# Patient Record
Sex: Female | Born: 1970
Health system: Southern US, Community
[De-identification: ages and names within clinical notes are randomized; demographics above are authoritative.]

## PROBLEM LIST (undated history)

## (undated) DIAGNOSIS — N189 Chronic kidney disease, unspecified: Secondary | ICD-10-CM

## (undated) DIAGNOSIS — J449 Chronic obstructive pulmonary disease, unspecified: Secondary | ICD-10-CM

## (undated) HISTORY — PX: FOOT FRACTURE SURGERY: SHX645

## (undated) HISTORY — PX: OTHER SURGICAL HISTORY: SHX169

---

## 1998-05-28 ENCOUNTER — Emergency Department (HOSPITAL_COMMUNITY): Admission: EM | Admit: 1998-05-28 | Discharge: 1998-05-28 | Payer: Self-pay | Admitting: Emergency Medicine

## 1998-05-28 ENCOUNTER — Encounter: Payer: Self-pay | Admitting: Emergency Medicine

## 2001-10-12 ENCOUNTER — Emergency Department (HOSPITAL_COMMUNITY): Admission: EM | Admit: 2001-10-12 | Discharge: 2001-10-12 | Payer: Self-pay

## 2001-10-12 ENCOUNTER — Encounter: Payer: Self-pay | Admitting: Emergency Medicine

## 2002-06-15 ENCOUNTER — Encounter: Payer: Self-pay | Admitting: Emergency Medicine

## 2002-06-15 ENCOUNTER — Inpatient Hospital Stay (HOSPITAL_COMMUNITY): Admission: EM | Admit: 2002-06-15 | Discharge: 2002-06-17 | Payer: Self-pay | Admitting: Emergency Medicine

## 2002-11-29 ENCOUNTER — Encounter: Payer: Self-pay | Admitting: Emergency Medicine

## 2002-11-29 ENCOUNTER — Emergency Department (HOSPITAL_COMMUNITY): Admission: EM | Admit: 2002-11-29 | Discharge: 2002-11-29 | Payer: Self-pay | Admitting: Emergency Medicine

## 2003-03-10 ENCOUNTER — Emergency Department (HOSPITAL_COMMUNITY): Admission: EM | Admit: 2003-03-10 | Discharge: 2003-03-11 | Payer: Self-pay | Admitting: Emergency Medicine

## 2005-09-21 ENCOUNTER — Inpatient Hospital Stay (HOSPITAL_COMMUNITY): Admission: EM | Admit: 2005-09-21 | Discharge: 2005-09-24 | Payer: Self-pay | Admitting: Emergency Medicine

## 2005-09-30 ENCOUNTER — Inpatient Hospital Stay (HOSPITAL_COMMUNITY): Admission: EM | Admit: 2005-09-30 | Discharge: 2005-10-04 | Payer: Self-pay | Admitting: Emergency Medicine

## 2006-02-22 ENCOUNTER — Inpatient Hospital Stay (HOSPITAL_COMMUNITY): Admission: EM | Admit: 2006-02-22 | Discharge: 2006-02-23 | Payer: Self-pay | Admitting: Emergency Medicine

## 2006-03-22 ENCOUNTER — Emergency Department (HOSPITAL_COMMUNITY): Admission: EM | Admit: 2006-03-22 | Discharge: 2006-03-22 | Payer: Self-pay | Admitting: Emergency Medicine

## 2006-03-24 ENCOUNTER — Emergency Department (HOSPITAL_COMMUNITY): Admission: EM | Admit: 2006-03-24 | Discharge: 2006-03-25 | Payer: Self-pay | Admitting: Emergency Medicine

## 2006-03-25 ENCOUNTER — Emergency Department (HOSPITAL_COMMUNITY): Admission: EM | Admit: 2006-03-25 | Discharge: 2006-03-25 | Payer: Self-pay | Admitting: Emergency Medicine

## 2006-03-27 ENCOUNTER — Emergency Department (HOSPITAL_COMMUNITY): Admission: EM | Admit: 2006-03-27 | Discharge: 2006-03-28 | Payer: Self-pay | Admitting: Emergency Medicine

## 2006-03-30 ENCOUNTER — Emergency Department (HOSPITAL_COMMUNITY): Admission: EM | Admit: 2006-03-30 | Discharge: 2006-03-30 | Payer: Self-pay | Admitting: Emergency Medicine

## 2006-08-02 ENCOUNTER — Emergency Department (HOSPITAL_COMMUNITY): Admission: EM | Admit: 2006-08-02 | Discharge: 2006-08-03 | Payer: Self-pay | Admitting: *Deleted

## 2006-12-16 ENCOUNTER — Emergency Department (HOSPITAL_COMMUNITY): Admission: EM | Admit: 2006-12-16 | Discharge: 2006-12-16 | Payer: Self-pay | Admitting: Emergency Medicine

## 2006-12-21 ENCOUNTER — Inpatient Hospital Stay (HOSPITAL_COMMUNITY): Admission: EM | Admit: 2006-12-21 | Discharge: 2006-12-24 | Payer: Self-pay | Admitting: Emergency Medicine

## 2010-04-19 ENCOUNTER — Emergency Department (HOSPITAL_BASED_OUTPATIENT_CLINIC_OR_DEPARTMENT_OTHER): Admission: EM | Admit: 2010-04-19 | Discharge: 2010-04-20 | Payer: Self-pay | Admitting: Emergency Medicine

## 2010-05-10 ENCOUNTER — Emergency Department (HOSPITAL_COMMUNITY)
Admission: EM | Admit: 2010-05-10 | Discharge: 2010-05-10 | Payer: Self-pay | Source: Home / Self Care | Admitting: Emergency Medicine

## 2010-08-18 LAB — DIFFERENTIAL
Basophils Absolute: 0 10*3/uL (ref 0.0–0.1)
Basophils Relative: 0 % (ref 0–1)
Eosinophils Absolute: 0.1 10*3/uL (ref 0.0–0.7)
Lymphs Abs: 1.5 10*3/uL (ref 0.7–4.0)
Monocytes Absolute: 0.6 10*3/uL (ref 0.1–1.0)
Neutro Abs: 8.1 10*3/uL — ABNORMAL HIGH (ref 1.7–7.7)
Neutrophils Relative %: 79 % — ABNORMAL HIGH (ref 43–77)

## 2010-08-18 LAB — BASIC METABOLIC PANEL
BUN: 8 mg/dL (ref 6–23)
CO2: 25 mEq/L (ref 19–32)
GFR calc Af Amer: >60 mL/min (ref >60)
GFR calc non Af Amer: >60 mL/min (ref >60)
Glucose, Bld: 100 mg/dL — ABNORMAL HIGH (ref 70–99)

## 2010-08-18 LAB — POCT PREGNANCY, URINE: Preg Test, Ur: NEGATIVE

## 2010-08-18 LAB — URINE MICROSCOPIC-ADD ON

## 2010-08-18 LAB — URINALYSIS, ROUTINE W REFLEX MICROSCOPIC: Bilirubin Urine: NEGATIVE

## 2010-08-18 LAB — CBC
HCT: 38.9 % (ref 36.0–46.0)
Hemoglobin: 13.6 g/dL (ref 12.0–15.0)
MCH: 31.1 pg (ref 26.0–34.0)
WBC: 10.3 10*3/uL (ref 4.0–10.5)

## 2010-10-21 NOTE — Discharge Summary (Signed)
[name], [name]                ACCOUNT NO.:  [id number]   MEDICAL RECORD NO.:  [id number]          PATIENT TYPE:  INP   LOCATION:  1307                         FACILITY:  [facility]   PHYSICIAN:  Beckey Rutter, MD  DATE OF BIRTH:  September 10, 1970   DATE OF ADMISSION:  12/21/2006  DATE OF DISCHARGE:  12/24/2006                               DISCHARGE SUMMARY   PRIMARY CARE PHYSICIAN:  Unassigned to [facility].   CHIEF COMPLAINT:  Left flank pain, dizziness and swelling.  She was  status post I & D on local abscess on December 20, 2006, in the emergency  department.[name]   HISTORY OF PRESENT ILLNESS:  A 40 year old female with past medical  history of MRSA, came with abscess and cellulitis.   HOSPITAL COURSE:  During hospitalization, the patient was seen and  evaluated by surgery and she was continued on antibiotic.  The culture  came back to E-coli sensitive to Cipro, Rocephin, cefazolin, gentamicin  and tobramycin and resistant to Bactrim and ampicillin.  Now the patient  is stable for discharge.  She will be discharged on Cipro and she was  recommended to follow up with her primary physician for surgery.  During  hospitalization, the patient was requiring significant amount of pain  medication, and now she was insistent on getting narcotic for pain  control.  I will discharge the patient on short supply of OxyContin  twice a day and Dilaudid p.o., hence the patient wanted Dilaudid because  it was really improving and elevating pain during hospitalization.  The  patient is stable for discharge today.  She is aware to the plan.   DISCHARGE DIAGNOSIS/FINAL DIAGNOSIS:  1. Left flank abscess/cellulitis sensitive to Cipro, Rocephin,      cefazolin, gentamicin and tobramycin and resistant to Bactrim and      ampicillin.  2. Hepatitis C diagnosed about 2 years ago.  3. History of recurrent pyelonephritis.  4. Chronic pain syndrome.  5. History of alcohol and tobacco abuse.  6. Remote history of  cocaine abuse.  7. History of kidney stones.  8. Status post IUD placement in the past.   DISCHARGE MEDICATIONS:  1. OxyContin.  2. Dilaudid p.o.  3. Cipro 250 mg p.o. b.i.d. for five more days.     Beckey Rutter, MD  Electronically Signed    EME/MEDQ  D:  12/24/2006  T:  12/25/2006  Job:  [id number]

## 2010-10-21 NOTE — H&P (Signed)
NAMEREGINA, GANCI NO.:  [id number]   MEDICAL RECORD NO.:  [id number]          PATIENT TYPE:  EMS   LOCATION:  ED                           FACILITY:  [facility]   PHYSICIAN:  Elliot Cousin, M.D.    DATE OF BIRTH:  May 19, 1971   DATE OF ADMISSION:  12/21/2006  DATE OF DISCHARGE:                              HISTORY & PHYSICAL   PRIMARY CARE PHYSICIAN:  The patient is unassigned.   CHIEF COMPLAINT:  Left flank pain, redness, swelling, status post I&D of  local abscess on December 20, 2006, in the Emergency Department.   HISTORY OF PRESENT ILLNESS:  The patient is a 40 year old woman with a  past medical history significant for hepatitis C, recurrent  pyelonephritis, and chronic pain syndrome.  She presented to the  Emergency Department last night after experiencing a 3-day history of  left flank pain.  She believes she was bitten by a bug approximately 3  days ago.  The area became slightly inflamed and red.  Over the next 24-  48 hours afterwards, the area became more swollen, more painful, and  more red.  She had associated nausea and chills.  At that time, it  appeared that the area was about to drain.  She presented to the  Emergency Department last night where apparently the local abscess was  excised and drained (no actual report available).  She was sent home  yesterday on doxycycline of which she took several doses.  However, the  area over the incision site has become much more red, swollen, and  painful.  In fact, the redness has spread several more inches according  to the patient and the patient's fiance.  The patient denies painful  urination, melena, hematochezia, abdominal pain, or bright red blood in  her urine.  She has had a couple of episodes of nausea and vomiting  which she believes are secondary to the infection and not necessarily  the antibiotic.   During the evaluation in the Emergency Department this morning, the  patient is noted to be  afebrile and hemodynamically stable.  Her white  blood cell count is elevated at 12.3.  An ultrasound of the abdomen and  kidneys was ordered by the Emergency Department physician to rule out a  deeper abscess.  The ultrasound was negative for abscess.  Given the  progression of the patient's symptoms and clinical findings, she will be  admitted for further evaluation and management.   PAST MEDICAL HISTORY:  1. Hepatitis C diagnosed approximately 1-1/2 years ago.  2. History of recurrent pyelonephritis.  3. Chronic pain syndrome.  4. History of alcohol and tobacco abuse/use.  5. Remote history of cocaine abuse, now discontinued.  6. History of kidney stones.  7. Status post IUD placement in the past.   MEDICATIONS:  1. Doxycycline 100 mg b.i.d.  2. Percocet 5 mg q.4 h. as needed for pain (prescribed by the      Emergency Department physician).   ALLERGIES:  No known drug allergies.   SOCIAL HISTORY:  The patient is engaged to be married.  She has 1 child.  [place]  She is currently unemployed.  She smokes 1 pack of cigarettes per day.  She drinks 3-4 beers once or  twice weekly.  She denies illicit drug use of any kind.   FAMILY HISTORY:  Her mother is 42 years of age and underwent recent  brain surgery for a benign tumor.  She also has hypertension.  Her  father is 66 years of age and has leukemia.   REVIEW OF SYSTEMS:  Positive for chronic pain, chronic urinary tract  infections.   PHYSICAL EXAMINATION:  VITAL SIGNS:  Temperature 97.8, blood pressure  111/82, pulse 76, respiratory rate 24, oxygen saturation 98% on room  air.  GENERAL:  The patient is a 40 year old, Caucasian woman who is currently  lying in bed in no acute distress.  HEENT:  Head is normocephalic and atraumatic.  Pupils equal, round, and  reactive to light.  Extraocular movements are intact.  Conjunctivae are  clear.  Sclerae are white.  Nasal mucosa is mildly dry.  No  sinus  tenderness.  Oropharynx reveals a full set of dentures.  Mucous  membranes are mildly dry.  No posterior exudates or erythema.  NECK:  Supple.  No adenopathy.  No thyromegaly.  No bruit.  No JVD.  LUNGS:  Clear to auscultation bilaterally.  HEART:  S1, S2 with no murmurs, rubs, or gallops.  ABDOMEN:  Left flank below the umbilicus reveals a large area of  erythema. The focal incision site is without any active purulent  drainage at this time.  There is surrounding induration and moderate  tenderness.  The remainder of the abdominal exam is within normal limits  with positive bowel sounds, soft, nondistended, and no  hepatosplenomegaly.  GU:  Deferred.  RECTAL:  Deferred.  EXTREMITIES:  Pedal pulses are 2+ bilaterally.  No pretibial edema and  no pedal edema.  NEUROLOGIC:  The patient is alert and oriented x3.  Cranial nerves II-  XII are intact.   ADMISSION LABORATORIES:  Ultrasound of the abdomen negative for abscess.  WBC 12.3, hemoglobin 13.9, platelets 202,000, sodium 133, potassium 3.2,  chloride 103, CO2 24, glucose 96, BUN 8, creatinine 0.68, calcium 8.6,  total protein 7.3, albumin 3.8, AST 19, ALT 33.   ASSESSMENT:  1. Left flank abscess/cellulitis.  The patient failed outpatient      therapy.  She is currently afebrile; however, she does have      leukocytosis.  2. Nausea and vomiting, possibly secondary to the infection.  Rule out      gastritis and acute pancreatitis.  3. Hypokalemia.  The patient's serum potassium is 3.2.  We will need      to rule out magnesium deficiency.  4. Tobacco and alcohol use.   PLAN:  1. The patient will be admitted for further evaluation and management.  2. For further evaluation, we will check wound and blood cultures.  3. We will check Lyme and [facility] spotted fever titers.  4. We will check a urinalysis.  We will also check a magnesium level      to rule out deficiency.  5. The patient was given a diphtheria tetanus  toxoid by the Emergency      Department physician.  6. We will start empiric antibiotic treatment with intravenous      vancomycin and Fortaz.  7. General surgery consultation.  8. Pain management.  9. Replete potassium chloride.  10.Tobacco and alcohol cessation counseling.  We will place a nicotine      patch.  We will start vitamin therapy and p.r.n. Ativan.      Elliot Cousin, M.D.  Electronically Signed     DF/MEDQ  D:  12/21/2006  T:  12/21/2006  Job:  [id number]

## 2010-10-24 NOTE — Discharge Summary (Signed)
[name], [name]            ACCOUNT NO.:  [id number]   MEDICAL RECORD NO.:  [id number]          PATIENT TYPE:  INP   LOCATION:  3012                         FACILITY:  [facility]   PHYSICIAN:  Lonia Blood, M.D.      DATE OF BIRTH:  [birth year]   DATE OF ADMISSION:  09/21/2005  DATE OF DISCHARGE:  09/24/2005                                 DISCHARGE SUMMARY   PRIMARY CARE PHYSICIAN:  Patient is unassigned, but she will get a primary  care physician at [facility].   DISCHARGE DIAGNOSES:  1.  Acute pyelonephritis.  2.  Tobacco abuse.  3.  Nausea/vomiting, probably secondary to pyelonephritis pain.   DISCHARGE MEDICATIONS:  1.  Ciprofloxacin 500 mg p.o. b.i.d. for 5 more days.  2.  Vicodin 5/500 1-2 tablets every 6 hours p.r.n.  3.  Phenergan 25 mg q.6h. p.r.n.   DISPOSITION:  Patient will be discharged today.  She lives in [place], and  she will follow up with her primary care physician over there.  She  currently is between doctors, and she is going to get a new doctor when she  goes home to [place].  Pain ieboros reasonably controlled right now.  She is  afebrile and white count has returned to normal.   PROCEDURES PERFORMED THIS ADMISSION:  CT abdomen and pelvis performed on  September 21, 2005, shows no acute pelvic finding and normal appearance of the  upper abdomen as well.   CONSULTATIONS:  None.   BRIEF HISTORY AND PHYSICAL:  Please refer to dictated history and physical  on admission by Dr. Lonia Blood.  It showed, however, patient is a 40-  year-old Caucasian female.  She has history of multiple episodes of  nephrolithiasis with some lithotripsy in the past.  She had temporary  stenting at the time, of the right ureter, back in August 2003.  Since then,  patient has had multiple urinary tract infections.  She came in secondary to  severe right flank pain.  Prior to that, she was in [facility] where  she was diagnosed with acute gastroenteritis.  On arrival in  the ED, patient  was having intractable nausea/vomiting, which has lasted more than 24 hours.  She has also had some fevers and chills intermittently.  Her labs show  urinalysis with small leukocyte esterase, 3-6 white blood cells.  She has  some trichomonas.  Her white count is elevated at 15,000.  She was having  some costovertebral angle tenderness, as well.  Patient was subsequently  admitted with an interim diagnosis of pyelonephritis.   HOSPITAL COURSE:  1.  Acute pyelonephritis:  Patient was admitted, started on IV ciprofloxacin      in the hospital.  She was getting that q.12h. so far in the hospital.      We will, therefore, continue with the ciprofloxacin orally, and she will      complete 7-10 days of antibiotics.  Her pain is still there but has      lessened.  We did a workup for possible kidney stones with her prior      history  of kidney stones to see if this is related; however, that has      been negative.  Urine culture was also checked and showed insignificant      growth at this point.  She will complete a coarse of 7 days of      antibiotics.  2.  Nausea/vomiting:  This is most likely secondary to her pyelo.  We      maintained the patient on some Phenergan in the hospital, including some      Zofran occasionally, and that has since resolved her nausea and vomiting      and she is eating adequately now.  3.  Tobacco abuse:  Patient has been counseled, and she also used a nicotine      patch while in the hospital.  She is thinking of quitting according to      her.  Otherwise, the patient has been stable for the most part, except      for severe pain that she has been looking for medications on and off      while in the hospital.      Lonia Blood, M.D.  Electronically Signed     LG/MEDQ  D:  09/24/2005  T:  09/25/2005  Job:  [id number]

## 2010-10-24 NOTE — Discharge Summary (Signed)
[name], [name] NO.:  [id number]   MEDICAL RECORD NO.:  [id number]                   PATIENT TYPE:  INP   LOCATION:  5532                                 FACILITY:  [facility]   PHYSICIAN:  Oretha Ellis, M.D.                DATE OF BIRTH:  12/05/1970   DATE OF ADMISSION:  06/15/2002  DATE OF DISCHARGE:  06/17/2002                                 DISCHARGE SUMMARY   DISCHARGE DIAGNOSES:  1. Pyelonephritis, Escherichia coli.  2. Bacterial vaginosis.  3. Menorrhagia.   DISCHARGE MEDICATIONS:  1. Bactrim Double Strength one pill b.i.d. for 14 days.  2. Flagyl 500 mg p.o. b.i.d. for five days.  3. Percocet 5/325 mg one to two tablets every four hours if needed for pain.   HOSPITAL COURSE:  [name] is a 40 year old female with past medical  history significant for stenting of her right ureter in August, 2003 along  with lithotripsy in August, 2003 and placement of an IUD about four years  ago.  The patient presented to the emergency department with severe back  pain, nausea, vomiting and fever of 101 times one day.  She took Tylenol for  the pain without relief.  She also complained of dysuria, increased urinary  symptoms and foul smelling urine.  She also complained of vaginal discharge  with foul odor approximately one month ago to which she presented to her  gynecologist and was treated with Flagyl.  In addition, the patient  complains of heavy menses lasting three weeks duration over the last three  months.  In the emergency department she was given 4 mg of Dilaudid and 10  mg of Toradol without any relief of her pain.  She was admitted for pain  control.   PHYSICAL EXAMINATION:  Her admitting vital signs - pulse 76, blood pressure  116/74, temperature 98.6, respirations 22, saturations 100% on room air.  She was an ill-appearing female who was writhing in pain and crying during  the examination.  Physical examination significant for  positive CVA  tenderness, left greater than right.  Pelvic examination showing no external  lesions or abnormalities.  No discharge.  Positive cervical motion  tenderness and adnexal suite tenderness.  CT scan of the abdomen showed no evidence of bone disease nor renal stones  nor hydronephrosis nor evidence of inflammatory disease.  Urinalysis was  positive nitrites, small leukocyte esterase and the microscopic showed many  bacteria, 3-6 white blood cells, 3-6 red blood cells.   HOSPITAL COURSE:  1. Pyelonephritis.  On day two of her hospitalization the patient was     started on Tequin and Flagyl.  The Tequin was to cover presumed     pyelonephritis and the Flagyl was to cover bacteria vaginosis because a     clue cell was found on her pelvic exam.  On day two of the     hospitalization urine culture  came back positive for greater than 100,000     colonies of E. Coli.  The patient defervesced and E. Coli was sensitive     to Septra so she was sent out on oral Septra Double Strength one tablet     twice a day for 14 days.  2. Bacteria vaginosis.  Patient was to continue a seven day course of     Flagyl.  3. Menorrhagia.  This is to be evaluated in the outpatient setting by the     patient's gynecologist.   DISPOSITION:  The patient was sent home in stable condition with Percocet  5/325 for pain control.  If she should have another occurrence of  pyelonephritis she will need to see a urologist to evaluate these frequent  urinary tract infections.  Of note, the patient had another hospitalization  for a similar episode in May, 2003.   DISCHARGE LABORATORY:  Sodium 138, potassium 4.3, chloride 108, bicarbonate  24, BUN 5, creatinine 0.7, glucose 92, potassium  8.2.                                               Oretha Ellis, M.D.    BT/MEDQ  D:  06/21/2002  T:  06/21/2002  Job:  [id number]

## 2010-10-24 NOTE — H&P (Signed)
NAMEVARETTA, CHAVERS NO.:  [id number]   MEDICAL RECORD NO.:  [id number]          PATIENT TYPE:  INP   LOCATION:  1823                         FACILITY:  [facility]   PHYSICIAN:  Lonia Blood, M.D.DATE OF BIRTH:  [birth year]   DATE OF ADMISSION:  09/21/2005  DATE OF DISCHARGE:                                HISTORY & PHYSICAL   PRIMARY CARE PHYSICIAN:  Unassigned.   CHIEF COMPLAINT:  Severe right flank pain with nausea and vomiting.   HISTORY OF PRESENT ILLNESS:  [name] is a 40 year old female  with a significant history of multiple episodes of nephrolithiasis.  She has  required lithotripsy in the past.  She has also had other complications  which she cannot describe further requiring a temporary stenting of the  right ureter in August of 2003.  Since that time, she has had frequent  recurrent urinary tract infections.  Approximately one week ago, she began  to feel tired all over.  She had no other localizing symptoms.  As the  week progressed, she began to experience vague nausea.  Her appetite  decreased.  She then began to feel intermittent fevers and chills.  Over the  last 48 hours, she has developed progressively worsening right-sided flank  pain.  This pain is described as 10/10 and is unrelenting.  Over the last 24  hours, this has become associated with severe nausea and vomiting.  The  patient has not been able to keep anything down by mouth.   She was seen in the emergency room in an outlying hospital last night and  diagnosed with a viral gastroenteritis and sent home without any further  therapy.   REVIEW OF SYSTEMS:  Comprehensive review of systems is unremarkable with the  exception of positive elements noted in the history of present illness  above.   PAST MEDICAL HISTORY:  1.  Admit with Escherichia coli pyelonephritis January 2004.  2.  History of bacterial vaginosis.  3.  Menorrhagia.  4.  Stenting of the right ureter in  August of 2003/reasons unclear.  5.  Lithotripsy August 2003.  6.  Tobacco abuse in the amount of one pack per day x15 years.   OUTPATIENT MEDICATIONS:  None.   ALLERGIES:  No known drug allergies.   FAMILY HISTORY:  The patient's mother is alive and healthy.  The patient's  father is alive but has leukemia and diabetes.  The patient reports a  strong cancer history on her father's side, though she cannot further  explain.  She has multiple brothers and sisters all of whom are healthy.   SOCIAL HISTORY:  The patient lives in [place].  She occasionally partakes  of alcohol but not more than one to two on average every other day.  The  patient is presently unemployed.  She has one child who is healthy.   LABORATORY DATA:  Urine pregnancy is negative.  Urinalysis reveals 250  glucose, small leukocyte esterase, 3 to 6 white blood cells and Trichomonas.  White count is elevated at 15,000, hemoglobin is normal, platelet count is  normal, MCV is normal.  Sodium, potassium, bicarbonate, chloride are normal.  BUN is 4, creatinine is 0.6, calcium is 9, serum glucose is elevated at 124.  LFTs are normal.  Albumin is normal.  Lipase is 23.  CT scan of the abdomen  and pelvis reveals no evidence of nephrolithiasis.  Appendix is normal.  There is an IUD noted to be present.  Exam is otherwise unremarkable.   PHYSICAL EXAMINATION:  VITAL SIGNS:  Temperature 96.8, blood pressure  129/84, heart rate 89, respiratory rate 20, O2 saturation is 95% on room  air.  GENERAL:  Well-developed, well-nourished female in no acute respiratory  distress.  HEENT:  Normocephalic and atraumatic.  OC/OP clear.  NECK:  No JVD, no lymphadenopathy.  LUNGS:  Clear to auscultation bilaterally without wheezes or rhonchi.  CARDIOVASCULAR:  Regular rate and rhythm without gallop, rub or appreciable  murmur.  ABDOMEN:  Soft.  Bowel sounds are hypoactive but present.  The patient is  tender to deep palpation throughout.   The patient shows exquisite tenderness  to palpation at the right costovertebral angle.  The patient is nondistended  and abdomen is soft.  EXTREMITIES:  No significant clubbing, cyanosis, or edema bilateral lower  extremities.  NEUROLOGICAL:  Nonfocal neurologic exam.   IMPRESSION/PLAN:  1.  Pyelonephritis:  The patient has a severe pyelonephritis.  This is      likely a complicated pyelonephritis in this patient who has a history of      nephrolithiasis and urinary stenting.  We of course will obtain a urine      culture to assure that this is not a drug-resistant organism to which      this patient would be highly susceptible.  She will be empirically      treated with Cipro IV q.12h.  This will be titrated to p.o. as soon as      the patient is able to tolerate p.o. intake.  There does not appear to      be evidence of an obstructing stone or other complicating factors at the      present time and therefore urology involvement is not needed presently.  2.  Intractable nausea and vomiting:  This is a result of the patient's      significant pyelonephritis.  The patient will be kept on clear liquids      only initially.  She will be given proton pump inhibitor and      antiemetics.  We will advance diet as able.  3.  Severe right costovertebral angle pain:  Again, this is a sequelae of      the patient's severe pyelonephritis.  Dilaudid will be administered IV      in attempts to control the patient's pain.  Phenergan will be given for      a synergistic effect.  4.  Trichomonas vaginitis:  The patient is primarily asymptomatic at the      present time.  She does, however, need to be treated as she is presently      sexually active.  I have not initiated treatment at present time as the      patient is not likely to be able to tolerate 2 gm of Flagyl p.o. at the      present time.  After the patient has improved, we should treat her with     Cipro.  She should also be informed that  her sexual partner likely needs      to be treated as well.  Lonia Blood, M.D.  Electronically Signed     JTM/MEDQ  D:  09/21/2005  T:  09/21/2005  Job:  [id number]

## 2010-10-24 NOTE — H&P (Signed)
[name], [name]            ACCOUNT NO.:  [id number]   MEDICAL RECORD NO.:  [id number]          PATIENT TYPE:  INP   LOCATION:  5736                         FACILITY:  [facility]   PHYSICIAN:  Jackie Plum, M.D.DATE OF BIRTH:  04-25-1971   DATE OF ADMISSION:  02/22/2006  DATE OF DISCHARGE:                                HISTORY & PHYSICAL   CHIEF COMPLAINT:  Flank pains.   HISTORY OF PRESENT ILLNESS:  The patient is a 40 year old Caucasian lady who  presented to the ED with complaint of 1-day history of severe bladder/flank  pain with nausea, vomiting and fever.  She has had no constipation or  diarrhea.  She denies any abdominal pain.  She denies any dizziness, chest  pain, shortness of breath, cough, sputum production.  She denies any  dysuria.  She has had some frequency of micturition.  The patient has been  treated for similar symptoms previously at [facility].   PAST MEDICAL HISTORY:  Positive for history of recurrent pyelonephritis and  ureteral calculi.  Has history of cigarette smoking, cocaine abuse, and  heavy alcohol use.  She has not taken alcohol for more than 2 weeks  according to the patient.  She has history of hepatitis C.  She has history  of chronic pain syndrome.   MEDICATIONS:  The patient has been taking Vicodin, Phenergan and Keflex  given in [facility].   ALLERGIES:  SHE HAS NO MEDICATION ALLERGIES.   FAMILY HISTORY:  Positive for diabetes mellitus and leukemia.   REVIEW OF SYSTEMS:  As stated above otherwise unremarkable.   PHYSICAL EXAMINATION:  GENERAL:  The patient was in severe pain.  VITAL SIGNS:  Stable.  Temperature was 97.6 degrees Fahrenheit.  NECK:  Supple with no JVD.  Oropharynx was moist.  No scleral pallor.  No  icterus.  CARDIOPULMONARY:  Auscultation was unremarkable.  ABDOMEN:  Soft and nontender.  The patient has bilateral costovertebral  angle tenderness which was quite severe.  EXTREMITIES:  No  cyanosis, no edema.  CNS:  Exam was nonfocal.   LABORATORY WORK:  Sodium 140, potassium 4.1, chloride 110, BUN 19, glucose  117.  Venous pH 7.423, venous pCO2 of 33.3, venous bicarbonate 21.8,  hematocrit of 43, hemoglobin 14.6.  UA:  Color was yellow, appearance  cloudy, specific gravity 1.028, pH 6.0, glucose negative, large hemoglobin,  negative bilirubin, negative ketones, negative protein, urobilinogen 1.0,  nitrite negative, moderate leukocyte esterase, wbc's 7-10 per high power  field, rbc's too numerous too count, few bacteria noted.  WBC count 7.5,  hemoglobin 14.7, hematocrit __________, MCV 2.3, platelet count 221.   X-RAYS:  CT scan of abdomen was negative for any acute findings, there was  no renal alkali or significant change in either kidney.  Pregnancy test was  negative.   IMPRESSION:  1. Severe bilateral flank pain in a patient with history of chronic pain.  2. Nausea and vomiting with pyuria.  3. Acute pyelonephritis.   PLAN:  The patient is admitted for IV fluids, IV analgesics with pain  control, antiemetics, antinausea, supportive care.  We will get  renal  ultrasound to further evaluate.  We will monitor her electrolytes.  We will  check her upper GI symptoms and follow clinically.  Further recommendations  will made after the database expands.      Jackie Plum, M.D.  Electronically Signed     GO/MEDQ  D:  02/22/2006  T:  02/22/2006  Job:  [id number]

## 2010-10-24 NOTE — Discharge Summary (Signed)
NAMEDABNEY, DEVER            ACCOUNT NO.:  [id number]   MEDICAL RECORD NO.:  [id number]          PATIENT TYPE:  INP   LOCATION:  5705                         FACILITY:  [facility]   PHYSICIAN:  Lonia Blood, M.D.      DATE OF BIRTH:  08/08/1970   DATE OF ADMISSION:  09/29/2005  DATE OF DISCHARGE:  10/04/2005                                 DISCHARGE SUMMARY   PRIMARY CARE PHYSICIAN:  Patient is unassigned to [place].  Her primary care  physician is in [place], [place].   DISCHARGE DIAGNOSES:  1.  Recurrent pyelonephritis with negative urine culture this time around.  2.  Chronic pain syndrome.  3.  Persistent nausea.  4.  Tobacco abuse.  5.  Hepatitis C positive.   DISCHARGE MEDICATIONS:  1.  Ciprofloxacin 500 mg p.o. b.i.d. x10 days.  2.  Zofran 4 mg q.4h. p.r.n.  3.  OxyContin 10 mg q.4h. p.r.n.   DISPOSITION:  Patient is to follow up this week with her primary care  physician in [place], [place].  She is also going to follow with her  previous urologist.  She apparently had a stent in the past placed in her  ureters but that has been removed.  With her recurrent symptoms, she will  need to be followed over there.  Work-up so far has been negative here.   PROCEDURES PERFORMED:  1.  CT abdomen and pelvis performed on September 30, 2005, shows no acute      abdominal findings an no acute CT pelvic findings.  2.  A pelvic ultrasound also performed on October 03, 2005, showed left      ovarian cyst measuring 2.9 cm, otherwise no acute findings.   CONSULTATIONS:  None.   BRIEF HISTORY AND PHYSICAL:  Please refer to dictated history and physical  by Dr. Hettie Holstein on admission.  In short, however, patient was  discharged from our service on September 24, 2005, secondary to same diagnosis  of acute pyelonephritis.  At that time, work-up was essentially negative.  The patient was on IV ciprofloxacin which was later switched to p.o. to  complete for seven to 10 days.  She  had a work-up for kidney stones which  was also negative.  Her urine culture was also negative at that time.  She  was subsequently discharged on antibiotics and pain medicine, but patient  returned this time around after five days with the same complaint, moaning  and writhing in pain.  Initial work-up in the ED showed mainly UA with wbc  too numerous to count.  Urine drug screen was positive for  tetrahydrocannabinol.  Patient was admitted for recurrent pyelonephritis.   HOSPITAL COURSE:  PROBLEM #1 -  RECURRENT PYELONEPHRITIS:  Patient was again  placed on IV ciprofloxacin.  She had no fever or white count.  Her urine  culture again came back as negative.  She continued to have pain or  perceived pain in the right flank area coming down to the front.  A repeat  CT abdomen, as shown above, shows no acute findings.  Subsequent pelvic  ultrasound to further see if there is any reason for the pain also was  negative.  She continued to use both Dilaudid, OxyContin and other narcotics  so frequently, sometimes every one to two hours.  I have discussed with the  patient that the cause of her pain has not been seen on any of our scans.  I  am, therefore, discharging her home to follow up with her primary care  physician and previous urologist.  Maybe they might be able to figure out  where the pain is coming from.  Her pain is relieved, however, with the pain  medicines and I am putting her back on the same medications.   PROBLEM #2 -  HEPATITIS C:  Patient's work-up in the hospital including  checking for hepatitis and other infectious causes, especially with the  right flank pain.  Her antibody came back as hepatitis C positive but others  were negative.  Patient denied any IV drug use.  With this in mind also, she  will require work-up follow-up including checking hepatitis C, RNA titers  which take a long time to return.  I have therefore informed her that she  will need to have her  primary care physician follow up on these work-ups.  I  doubt if the hepatitis C has anything to do with the pain she is having  right now.   PROBLEM #3 -  TOBACCO ABUSE:  We have counseled the patient extensively and  offered her nicotine patch which she continues to smoke at least a pack per  day.   PROBLEM #4 -  PERSISTENT NAUSEA:  Patient has had persistent nausea which  was thought to be secondary to her pyelonephritis.  She has also used  several doses of both Zofran and Phenergan more frequently.  I am  discharging her on some doses to take at home until she is seen by her  primary care physician.      Lonia Blood, M.D.  Electronically Signed     LG/MEDQ  D:  10/04/2005  T:  10/05/2005  Job:  [id number]

## 2011-03-23 LAB — DIFFERENTIAL
Basophils Absolute: 0
Eosinophils Absolute: 0.1
Eosinophils Absolute: 0.2
Lymphocytes Relative: 10 — ABNORMAL LOW
Lymphocytes Relative: 9 — ABNORMAL LOW
Lymphs Abs: 0.5 — ABNORMAL LOW
Monocytes Absolute: 0.5
Monocytes Relative: 7
Neutro Abs: 10.5 — ABNORMAL HIGH
Neutro Abs: 4
Neutrophils Relative %: 80 — ABNORMAL HIGH

## 2011-03-23 LAB — CULTURE, BLOOD (ROUTINE X 2)
Culture: NO GROWTH
Culture: NO GROWTH

## 2011-03-23 LAB — URINALYSIS, ROUTINE W REFLEX MICROSCOPIC
Bilirubin Urine: NEGATIVE
Ketones, ur: NEGATIVE
Nitrite: NEGATIVE
Protein, ur: NEGATIVE
Urobilinogen, UA: 0.2
pH: 6

## 2011-03-23 LAB — CBC
HCT: 39.3
Hemoglobin: 13.9
Platelets: 156
Platelets: 202
RBC: 3.85 — ABNORMAL LOW
RBC: 4.48
RDW: 13.6
WBC: 12.3 — ABNORMAL HIGH
WBC: 4.9

## 2011-03-23 LAB — URINE CULTURE

## 2011-03-23 LAB — COMPREHENSIVE METABOLIC PANEL
ALT: 33
Alkaline Phosphatase: 68
GFR calc non Af Amer: >60
Glucose, Bld: 96
Potassium: 3.2 — ABNORMAL LOW
Sodium: 133 — ABNORMAL LOW
Total Bilirubin: 0.9

## 2011-03-23 LAB — LIPASE, BLOOD
Lipase: 14
Lipase: 68 — ABNORMAL HIGH

## 2011-03-23 LAB — BASIC METABOLIC PANEL
BUN: 6
Calcium: 8 — ABNORMAL LOW
Chloride: 107
Creatinine, Ser: 0.73
GFR calc Af Amer: >60
GFR calc non Af Amer: >60

## 2011-03-23 LAB — B. BURGDORFI ANTIBODIES: B burgdorferi Ab IgG+IgM: 0.16

## 2011-03-23 LAB — MAGNESIUM
Magnesium: 1.8
Magnesium: 1.9

## 2011-03-23 LAB — URINE MICROSCOPIC-ADD ON

## 2011-03-23 LAB — TSH: TSH: 0.83

## 2011-03-24 LAB — URINE MICROSCOPIC-ADD ON

## 2011-03-24 LAB — URINALYSIS, ROUTINE W REFLEX MICROSCOPIC
Glucose, UA: NEGATIVE
Ketones, ur: NEGATIVE
Protein, ur: NEGATIVE
Urobilinogen, UA: 0.2

## 2015-11-20 DIAGNOSIS — J9601 Acute respiratory failure with hypoxia: Secondary | ICD-10-CM | POA: Diagnosis not present

## 2015-11-20 DIAGNOSIS — Z720 Tobacco use: Secondary | ICD-10-CM

## 2015-11-20 DIAGNOSIS — F419 Anxiety disorder, unspecified: Secondary | ICD-10-CM

## 2015-11-20 DIAGNOSIS — J189 Pneumonia, unspecified organism: Secondary | ICD-10-CM

## 2015-11-20 DIAGNOSIS — G8929 Other chronic pain: Secondary | ICD-10-CM | POA: Diagnosis not present

## 2015-11-20 DIAGNOSIS — J441 Chronic obstructive pulmonary disease with (acute) exacerbation: Secondary | ICD-10-CM

## 2015-11-21 DIAGNOSIS — J9601 Acute respiratory failure with hypoxia: Secondary | ICD-10-CM | POA: Diagnosis not present

## 2015-11-21 DIAGNOSIS — F419 Anxiety disorder, unspecified: Secondary | ICD-10-CM | POA: Diagnosis not present

## 2015-11-21 DIAGNOSIS — Z720 Tobacco use: Secondary | ICD-10-CM | POA: Diagnosis not present

## 2015-11-21 DIAGNOSIS — G8929 Other chronic pain: Secondary | ICD-10-CM | POA: Diagnosis not present

## 2015-11-22 DIAGNOSIS — Z720 Tobacco use: Secondary | ICD-10-CM | POA: Diagnosis not present

## 2015-11-22 DIAGNOSIS — F419 Anxiety disorder, unspecified: Secondary | ICD-10-CM | POA: Diagnosis not present

## 2015-11-22 DIAGNOSIS — J9601 Acute respiratory failure with hypoxia: Secondary | ICD-10-CM | POA: Diagnosis not present

## 2015-11-22 DIAGNOSIS — G8929 Other chronic pain: Secondary | ICD-10-CM | POA: Diagnosis not present

## 2018-12-08 ENCOUNTER — Encounter (HOSPITAL_COMMUNITY): Payer: Self-pay

## 2018-12-08 ENCOUNTER — Inpatient Hospital Stay (HOSPITAL_COMMUNITY)
Admission: AD | Admit: 2018-12-08 | Discharge: 2018-12-18 | DRG: 871 | Disposition: A | Discharge status: Home Health | Payer: Medicaid Other | Source: Other Acute Inpatient Hospital | Attending: Student | Admitting: Student

## 2018-12-08 DIAGNOSIS — J189 Pneumonia, unspecified organism: Secondary | ICD-10-CM | POA: Diagnosis present

## 2018-12-08 DIAGNOSIS — F10239 Alcohol dependence with withdrawal, unspecified: Secondary | ICD-10-CM | POA: Diagnosis present

## 2018-12-08 DIAGNOSIS — J15211 Pneumonia due to Methicillin susceptible Staphylococcus aureus: Secondary | ICD-10-CM | POA: Diagnosis present

## 2018-12-08 DIAGNOSIS — J440 Chronic obstructive pulmonary disease with acute lower respiratory infection: Secondary | ICD-10-CM | POA: Diagnosis present

## 2018-12-08 DIAGNOSIS — Z8619 Personal history of other infectious and parasitic diseases: Secondary | ICD-10-CM | POA: Diagnosis present

## 2018-12-08 DIAGNOSIS — G894 Chronic pain syndrome: Secondary | ICD-10-CM | POA: Diagnosis present

## 2018-12-08 DIAGNOSIS — D6959 Other secondary thrombocytopenia: Secondary | ICD-10-CM | POA: Diagnosis present

## 2018-12-08 DIAGNOSIS — D649 Anemia, unspecified: Secondary | ICD-10-CM | POA: Diagnosis present

## 2018-12-08 DIAGNOSIS — E860 Dehydration: Secondary | ICD-10-CM | POA: Diagnosis present

## 2018-12-08 DIAGNOSIS — J9621 Acute and chronic respiratory failure with hypoxia: Secondary | ICD-10-CM | POA: Diagnosis present

## 2018-12-08 DIAGNOSIS — E876 Hypokalemia: Secondary | ICD-10-CM | POA: Diagnosis not present

## 2018-12-08 DIAGNOSIS — J449 Chronic obstructive pulmonary disease, unspecified: Secondary | ICD-10-CM | POA: Diagnosis present

## 2018-12-08 DIAGNOSIS — A4101 Sepsis due to Methicillin susceptible Staphylococcus aureus: Principal | ICD-10-CM | POA: Diagnosis present

## 2018-12-08 DIAGNOSIS — F1410 Cocaine abuse, uncomplicated: Secondary | ICD-10-CM | POA: Diagnosis present

## 2018-12-08 DIAGNOSIS — M7552 Bursitis of left shoulder: Secondary | ICD-10-CM | POA: Diagnosis present

## 2018-12-08 DIAGNOSIS — Z825 Family history of asthma and other chronic lower respiratory diseases: Secondary | ICD-10-CM

## 2018-12-08 DIAGNOSIS — F17200 Nicotine dependence, unspecified, uncomplicated: Secondary | ICD-10-CM | POA: Diagnosis present

## 2018-12-08 DIAGNOSIS — K219 Gastro-esophageal reflux disease without esophagitis: Secondary | ICD-10-CM | POA: Diagnosis present

## 2018-12-08 DIAGNOSIS — Z09 Encounter for follow-up examination after completed treatment for conditions other than malignant neoplasm: Secondary | ICD-10-CM

## 2018-12-08 DIAGNOSIS — R05 Cough: Secondary | ICD-10-CM

## 2018-12-08 DIAGNOSIS — D638 Anemia in other chronic diseases classified elsewhere: Secondary | ICD-10-CM | POA: Diagnosis present

## 2018-12-08 DIAGNOSIS — Z20828 Contact with and (suspected) exposure to other viral communicable diseases: Secondary | ICD-10-CM | POA: Diagnosis present

## 2018-12-08 DIAGNOSIS — Z806 Family history of leukemia: Secondary | ICD-10-CM | POA: Diagnosis not present

## 2018-12-08 DIAGNOSIS — R059 Cough, unspecified: Secondary | ICD-10-CM

## 2018-12-08 DIAGNOSIS — F1990 Other psychoactive substance use, unspecified, uncomplicated: Secondary | ICD-10-CM | POA: Diagnosis present

## 2018-12-08 DIAGNOSIS — B9561 Methicillin susceptible Staphylococcus aureus infection as the cause of diseases classified elsewhere: Secondary | ICD-10-CM

## 2018-12-08 DIAGNOSIS — E871 Hypo-osmolality and hyponatremia: Secondary | ICD-10-CM | POA: Diagnosis present

## 2018-12-08 DIAGNOSIS — F419 Anxiety disorder, unspecified: Secondary | ICD-10-CM | POA: Diagnosis present

## 2018-12-08 DIAGNOSIS — D696 Thrombocytopenia, unspecified: Secondary | ICD-10-CM | POA: Diagnosis present

## 2018-12-08 DIAGNOSIS — M25519 Pain in unspecified shoulder: Secondary | ICD-10-CM

## 2018-12-08 DIAGNOSIS — M25512 Pain in left shoulder: Secondary | ICD-10-CM | POA: Diagnosis present

## 2018-12-08 HISTORY — DX: Chronic kidney disease, unspecified: N18.9

## 2018-12-08 HISTORY — DX: Chronic obstructive pulmonary disease, unspecified: J44.9

## 2018-12-08 LAB — CBC WITH DIFFERENTIAL/PLATELET
Abs Immature Granulocytes: 0.1 10*3/uL — ABNORMAL HIGH (ref 0.00–0.07)
Basophils Absolute: 0 10*3/uL (ref 0.0–0.1)
Basophils Relative: 0 %
Eosinophils Absolute: 0 10*3/uL (ref 0.0–0.5)
Eosinophils Relative: 0 %
HCT: 32.8 % — ABNORMAL LOW (ref 36.0–46.0)
Hemoglobin: 11.1 g/dL — ABNORMAL LOW (ref 12.0–15.0)
Immature Granulocytes: 2 %
Lymphocytes Relative: 9 %
Lymphs Abs: 0.4 10*3/uL — ABNORMAL LOW (ref 0.7–4.0)
MCH: 30.2 pg (ref 26.0–34.0)
MCHC: 33.8 g/dL (ref 30.0–36.0)
MCV: 89.1 fL (ref 80.0–100.0)
Monocytes Absolute: 0.4 10*3/uL (ref 0.1–1.0)
Monocytes Relative: 8 %
Neutro Abs: 3.5 10*3/uL (ref 1.7–7.7)
Neutrophils Relative %: 81 %
Platelets: 53 10*3/uL — ABNORMAL LOW (ref 150–400)
RBC: 3.68 MIL/uL — ABNORMAL LOW (ref 3.87–5.11)
RDW: 13.2 % (ref 11.5–15.5)
WBC: 4.3 10*3/uL (ref 4.0–10.5)
nRBC: 0 % (ref 0.0–0.2)

## 2018-12-08 LAB — GLUCOSE, CAPILLARY: Glucose-Capillary: 110 mg/dL — ABNORMAL HIGH (ref 70–99)

## 2018-12-08 LAB — COMPREHENSIVE METABOLIC PANEL
ALT: 18 U/L (ref 0–44)
AST: 21 U/L (ref 15–41)
Albumin: 3.2 g/dL — ABNORMAL LOW (ref 3.5–5.0)
Alkaline Phosphatase: 46 U/L (ref 38–126)
Anion gap: 11 (ref 5–15)
BUN: 8 mg/dL (ref 6–20)
CO2: 26 mmol/L (ref 22–32)
Calcium: 8.1 mg/dL — ABNORMAL LOW (ref 8.9–10.3)
Chloride: 93 mmol/L — ABNORMAL LOW (ref 98–111)
Creatinine, Ser: 0.78 mg/dL (ref 0.44–1.00)
GFR calc Af Amer: >60 mL/min (ref >60)
GFR calc non Af Amer: >60 mL/min (ref >60)
Glucose, Bld: 122 mg/dL — ABNORMAL HIGH (ref 70–99)
Potassium: 3.2 mmol/L — ABNORMAL LOW (ref 3.5–5.1)
Sodium: 130 mmol/L — ABNORMAL LOW (ref 135–145)
Total Bilirubin: 1 mg/dL (ref 0.3–1.2)
Total Protein: 7 g/dL (ref 6.5–8.1)

## 2018-12-08 MED ORDER — SODIUM CHLORIDE 0.9 % IV SOLN
INTRAVENOUS | Status: DC
  Administered 2018-12-08: 1000 mL via INTRAVENOUS
  Administered 2018-12-11 – 2018-12-12 (×2): via INTRAVENOUS

## 2018-12-08 MED ORDER — SODIUM CHLORIDE 0.9 % IV SOLN
500.0000 mg | INTRAVENOUS | Status: DC
  Administered 2018-12-08: 500 mg via INTRAVENOUS
  Filled 2018-12-08 (×2): qty 500

## 2018-12-08 MED ORDER — SODIUM CHLORIDE 0.9 % IV SOLN
2.0000 g | INTRAVENOUS | Status: DC
  Administered 2018-12-08: 2 g via INTRAVENOUS
  Filled 2018-12-08: qty 2
  Filled 2018-12-08: qty 20

## 2018-12-08 MED ORDER — ENOXAPARIN SODIUM 40 MG/0.4ML ~~LOC~~ SOLN
40.0000 mg | SUBCUTANEOUS | Status: DC
  Administered 2018-12-08: 40 mg via SUBCUTANEOUS
  Filled 2018-12-08: qty 0.4

## 2018-12-08 NOTE — H&P (Signed)
History and Physical   [name] ZOX:[id number] DOB: 05/29/1971 DOA: 12/08/2018  Referring MD/NP/PA: From [facility]  PCP: No primary care provider on file.   Outpatient Specialists: None  Patient coming from: [facility]  Chief Complaint: Shortness of breath  HPI: [name] is a 48 y.o. female with medical history significant of IV drug abuse, emphysema who presented to [facility] with acute respiratory failure and hypoxemia.  Pulse ox was in the 80s.  Patient received some breathing treatment with slight improvement.  She was requiring 2 L of oxygen which is new.  Chest x-ray was clear and CT angiogram of the chest was also negative for PE.  It did show possible upper right upper lobe pneumonia versus solid mass.  Also evidence of emphysema and coronary artery disease.  She has been on chronic opiates.  Also use of amphetamines is noted.  Patient was evaluated fully with lymphopenia elevated ferritin and CRP.  COVID-19 testing performed was negative.  Other findings were normal troponin normal LDH and hypokalemia was noted.  Patient has been transferred here with a suspicion that the call with result is falsely negative.  She is still requiring up to 2 L of oxygen.  She is hyponatremic and dehydrated.  Denied any chest pain.  No reported contact with anybody was COVID-19.[name]  Review of Systems: As per HPI otherwise 10 point review of systems negative.    History reviewed. No pertinent past medical history.  History reviewed. No pertinent surgical history.   reports that she has been smoking. She has never used smokeless tobacco. No history on file for alcohol and drug.  Allergies  Allergen Reactions  . Morphine And Related   . Zofran [Ondansetron Hcl]     History reviewed. No pertinent family history.   Prior to Admission medications   Not on File    Physical Exam: Vitals:   12/08/18 2000 12/08/18 2030 12/09/18 0015  BP: 114/70 114/70  90/78  Pulse: (!) 110 (!) 110 (!) 116  Resp: 20 20   Temp: (!) 103.3 F (39.6 C) (!) 103.3 F (39.6 C) 99.6 F (37.6 C)  TempSrc: Oral Oral Oral  SpO2: 96% 96% 100%  Weight: 61.7 kg 61 kg   Height: 5\' 5"  (1.651 m) 5\' 5"  (1.651 m)       Constitutional: NAD, calm, comfortable Vitals:   12/08/18 2000 12/08/18 2030 12/09/18 0015  BP: 114/70 114/70 90/78  Pulse: (!) 110 (!) 110 (!) 116  Resp: 20 20   Temp: (!) 103.3 F (39.6 C) (!) 103.3 F (39.6 C) 99.6 F (37.6 C)  TempSrc: Oral Oral Oral  SpO2: 96% 96% 100%  Weight: 61.7 kg 61 kg   Height: 5\' 5"  (1.651 m) 5\' 5"  (1.651 m)    Eyes: PERRL, lids and conjunctivae normal ENMT: Mucous membranes are moist. Posterior pharynx clear of any exudate or lesions.Normal dentition.  Neck: normal, supple, no masses, no thyromegaly Respiratory: clear to auscultation bilaterally, no wheezing, no crackles. Normal respiratory effort. No accessory muscle use.  Cardiovascular: Regular rate and rhythm, no murmurs / rubs / gallops. No extremity edema. 2+ pedal pulses. No carotid bruits.  Abdomen: no tenderness, no masses palpated. No hepatosplenomegaly. Bowel sounds positive.  Musculoskeletal: no clubbing / cyanosis. No joint deformity upper and lower extremities. Good ROM, no contractures. Normal muscle tone.  Skin: no rashes, lesions, ulcers. No induration Neurologic: CN 2-12 grossly intact. Sensation intact, DTR normal. Strength 5/5 in all 4.  Psychiatric: Normal  judgment and insight. Alert and oriented x 3. Normal mood.     Labs on Admission: I have personally reviewed following labs and imaging studies  CBC: Recent Labs  Lab 12/08/18 2223  WBC 4.3  NEUTROABS 3.5  HGB 11.1*  HCT 32.8*  MCV 89.1  PLT 53*   Basic Metabolic Panel: Recent Labs  Lab 12/08/18 2223  NA 130*  K 3.2*  CL 93*  CO2 26  GLUCOSE 122*  BUN 8  CREATININE 0.78  CALCIUM 8.1*   GFR: Estimated Creatinine Clearance: 79.9 mL/min (by C-G formula based on SCr  of 0.78 mg/dL). Liver Function Tests: Recent Labs  Lab 12/08/18 2223  AST 21  ALT 18  ALKPHOS 46  BILITOT 1.0  PROT 7.0  ALBUMIN 3.2*   No results for input(s): LIPASE, AMYLASE in the last 168 hours. No results for input(s): AMMONIA in the last 168 hours. Coagulation Profile: No results for input(s): INR, PROTIME in the last 168 hours. Cardiac Enzymes: No results for input(s): CKTOTAL, CKMB, CKMBINDEX, TROPONINI in the last 168 hours. BNP (last 3 results) No results for input(s): PROBNP in the last 8760 hours. HbA1C: No results for input(s): HGBA1C in the last 72 hours. CBG: Recent Labs  Lab 12/08/18 2235  GLUCAP 110*   Lipid Profile: No results for input(s): CHOL, HDL, LDLCALC, TRIG, CHOLHDL, LDLDIRECT in the last 72 hours. Thyroid Function Tests: No results for input(s): TSH, T4TOTAL, FREET4, T3FREE, THYROIDAB in the last 72 hours. Anemia Panel: No results for input(s): VITAMINB12, FOLATE, FERRITIN, TIBC, IRON, RETICCTPCT in the last 72 hours. Urine analysis:    Component Value Date/Time   COLORURINE YELLOW 05/10/2010 1929   APPEARANCEUR TURBID (A) 05/10/2010 1929   LABSPEC 1.020 05/10/2010 1929   PHURINE 7.0 05/10/2010 1929   GLUCOSEU NEGATIVE 05/10/2010 1929   HGBUR TRACE (A) 05/10/2010 1929   BILIRUBINUR NEGATIVE 05/10/2010 1929   KETONESUR NEGATIVE 05/10/2010 1929   PROTEINUR 30 (A) 05/10/2010 1929   UROBILINOGEN 1.0 05/10/2010 1929   NITRITE POSITIVE (A) 05/10/2010 1929   LEUKOCYTESUR LARGE (A) 05/10/2010 1929   Sepsis Labs: @LABRCNTIP (procalcitonin:4,lacticidven:4) ) Recent Results (from the past 240 hour(s))  Culture, blood (routine x 2) Call MD if unable to obtain prior to antibiotics being given     Status: None (Preliminary result)   Collection Time: 12/08/18 10:20 PM   Specimen: BLOOD RIGHT ARM  Result Value Ref Range Status   Specimen Description BLOOD RIGHT ARM  Final   Special Requests   Final    BOTTLES DRAWN AEROBIC ONLY Blood Culture  results may not be optimal due to an inadequate volume of blood received in culture bottles Performed at [facility], [address] [place], [place] [address]    Culture PENDING  Incomplete   Report Status PENDING  Incomplete     Radiological Exams on Admission: No results found.  Assessment/Plan Principal Problem:   Acute on chronic respiratory failure with hypoxia (HCC) Active Problems:   Pneumonia   Hyponatremia   Hypokalemia   COPD (chronic obstructive pulmonary disease) (HCC)     #1 acute respiratory failure with hypoxia: Suspected COPD exacerbation and pneumonia.  Initiate antibiotics with inhalers.  Solu-Medrol will be initiated as well.  Repeat COVID testing to rule out COVID-19 pneumonia.  Is not classic on her CT.  #2 right upper lobe pneumonia: [place] from [place].  IV antibiotics and repeat testing in the morning.  #3 chronic pain syndrome: Confirmed on home regimen and resume.  #  4 reported polysubstance abuse: Patient denies any IV drug abuse.  Monitor closely for any symptoms of withdrawal.  #5 hypokalemia: Replete potassium.  #6 hyponatremia: Possibly dehydration related.  Hydrate aggressively.  #7 anxiety disorder: Patient appears very anxious.  Treat accordingly.   DVT prophylaxis: Lovenox Code Status: Full code Family Communication: Discussed carefully with the patient Disposition Plan: Home Consults called: None Admission status: Inpatient  Severity of Illness: The appropriate patient status for this patient is INPATIENT. Inpatient status is judged to be reasonable and necessary in order to provide the required intensity of service to ensure the patient's safety. The patient's presenting symptoms, physical exam findings, and initial radiographic and laboratory data in the context of their chronic comorbidities is felt to place them at high risk for further clinical deterioration. Furthermore, it is not anticipated that the patient will be  medically stable for discharge from the hospital within 2 midnights of admission. The following factors support the patient status of inpatient.   " The patient's presenting symptoms include shortness of breath and hypoxia. " The worrisome physical exam findings include coarse breath sounds bilaterally. " The initial radiographic and laboratory data are worrisome because of CT reporting right upper lobe infiltrate. " The chronic co-morbidities include COPD.   * I certify that at the point of admission it is my clinical judgment that the patient will require inpatient hospital care spanning beyond 2 midnights from the point of admission due to high intensity of service, high risk for further deterioration and high frequency of surveillance required.Lonia Blood*    Ollie Delano,LAWAL MD [facility] Pager [phone]  If 7PM-7AM, please contact night-coverage [email or web address] Password [email or web address]  12/09/2018, 12:31 AM

## 2018-12-08 NOTE — Progress Notes (Signed)
Saw order for sputum, went in and explained to patient procedure for collection.  Patient stated the best time for her was in the morning after taking her medication.  Left collection jar for sputum at bedside for patient in the morning.  Will continue to monitor.

## 2018-12-09 ENCOUNTER — Inpatient Hospital Stay (HOSPITAL_COMMUNITY): Payer: Medicaid Other

## 2018-12-09 ENCOUNTER — Other Ambulatory Visit: Payer: Self-pay

## 2018-12-09 ENCOUNTER — Encounter (HOSPITAL_COMMUNITY): Payer: Self-pay | Admitting: Internal Medicine

## 2018-12-09 DIAGNOSIS — J449 Chronic obstructive pulmonary disease, unspecified: Secondary | ICD-10-CM | POA: Diagnosis present

## 2018-12-09 DIAGNOSIS — D696 Thrombocytopenia, unspecified: Secondary | ICD-10-CM | POA: Diagnosis present

## 2018-12-09 DIAGNOSIS — D649 Anemia, unspecified: Secondary | ICD-10-CM | POA: Diagnosis present

## 2018-12-09 DIAGNOSIS — B9561 Methicillin susceptible Staphylococcus aureus infection as the cause of diseases classified elsewhere: Secondary | ICD-10-CM | POA: Diagnosis present

## 2018-12-09 DIAGNOSIS — E871 Hypo-osmolality and hyponatremia: Secondary | ICD-10-CM

## 2018-12-09 DIAGNOSIS — M25512 Pain in left shoulder: Secondary | ICD-10-CM | POA: Diagnosis present

## 2018-12-09 DIAGNOSIS — R7881 Bacteremia: Secondary | ICD-10-CM | POA: Diagnosis present

## 2018-12-09 DIAGNOSIS — E876 Hypokalemia: Secondary | ICD-10-CM

## 2018-12-09 DIAGNOSIS — Z8619 Personal history of other infectious and parasitic diseases: Secondary | ICD-10-CM | POA: Diagnosis present

## 2018-12-09 DIAGNOSIS — F1990 Other psychoactive substance use, unspecified, uncomplicated: Secondary | ICD-10-CM | POA: Diagnosis present

## 2018-12-09 LAB — MRSA PCR SCREENING: MRSA by PCR: NEGATIVE

## 2018-12-09 LAB — BLOOD CULTURE ID PANEL (REFLEXED)

## 2018-12-09 LAB — CBC
HCT: 30 % — ABNORMAL LOW (ref 36.0–46.0)
Hemoglobin: 10.2 g/dL — ABNORMAL LOW (ref 12.0–15.0)
MCH: 29.9 pg (ref 26.0–34.0)
MCHC: 34 g/dL (ref 30.0–36.0)
MCV: 88 fL (ref 80.0–100.0)
Platelets: 45 10*3/uL — ABNORMAL LOW (ref 150–400)
RBC: 3.41 MIL/uL — ABNORMAL LOW (ref 3.87–5.11)
RDW: 13.3 % (ref 11.5–15.5)
WBC: 3.4 10*3/uL — ABNORMAL LOW (ref 4.0–10.5)
nRBC: 0 % (ref 0.0–0.2)

## 2018-12-09 LAB — COMPREHENSIVE METABOLIC PANEL
ALT: 22 U/L (ref 0–44)
AST: 30 U/L (ref 15–41)
Albumin: 2.7 g/dL — ABNORMAL LOW (ref 3.5–5.0)
Alkaline Phosphatase: 48 U/L (ref 38–126)
Anion gap: 11 (ref 5–15)
BUN: 10 mg/dL (ref 6–20)
CO2: 24 mmol/L (ref 22–32)
Calcium: 8.1 mg/dL — ABNORMAL LOW (ref 8.9–10.3)
Chloride: 102 mmol/L (ref 98–111)
Creatinine, Ser: 0.5 mg/dL (ref 0.44–1.00)
GFR calc Af Amer: >60 mL/min (ref >60)
GFR calc non Af Amer: >60 mL/min (ref >60)
Glucose, Bld: 158 mg/dL — ABNORMAL HIGH (ref 70–99)
Potassium: 4.3 mmol/L (ref 3.5–5.1)
Sodium: 137 mmol/L (ref 135–145)
Total Bilirubin: 0.5 mg/dL (ref 0.3–1.2)
Total Protein: 6.2 g/dL — ABNORMAL LOW (ref 6.5–8.1)

## 2018-12-09 LAB — RAPID URINE DRUG SCREEN, HOSP PERFORMED
Amphetamines: NOT DETECTED
Barbiturates: NOT DETECTED
Benzodiazepines: POSITIVE — AB
Cocaine: NOT DETECTED
Opiates: POSITIVE — AB
Tetrahydrocannabinol: NOT DETECTED

## 2018-12-09 LAB — FOLATE: Folate: 12.2 ng/mL (ref >5.9)

## 2018-12-09 LAB — RETICULOCYTES
Immature Retic Fract: 0.6 % — ABNORMAL LOW (ref 2.3–15.9)
RBC.: 3.37 MIL/uL — ABNORMAL LOW (ref 3.87–5.11)
Retic Count, Absolute: 33.7 10*3/uL (ref 19.0–186.0)
Retic Ct Pct: 1 % (ref 0.4–3.1)

## 2018-12-09 LAB — FERRITIN: Ferritin: 579 ng/mL — ABNORMAL HIGH (ref 11–307)

## 2018-12-09 LAB — STREP PNEUMONIAE URINARY ANTIGEN: Strep Pneumo Urinary Antigen: NEGATIVE

## 2018-12-09 LAB — SAVE SMEAR(SSMR), FOR PROVIDER SLIDE REVIEW

## 2018-12-09 LAB — SARS CORONAVIRUS 2 BY RT PCR (HOSPITAL ORDER, PERFORMED IN ~~LOC~~ HOSPITAL LAB): SARS Coronavirus 2: NEGATIVE

## 2018-12-09 LAB — IRON AND TIBC
Iron: 16 ug/dL — ABNORMAL LOW (ref 28–170)
Saturation Ratios: 8 % — ABNORMAL LOW (ref 10.4–31.8)
TIBC: 190 ug/dL — ABNORMAL LOW (ref 250–450)
UIBC: 174 ug/dL

## 2018-12-09 LAB — HIV ANTIBODY (ROUTINE TESTING W REFLEX): HIV Screen 4th Generation wRfx: NONREACTIVE

## 2018-12-09 LAB — LACTIC ACID, PLASMA: Lactic Acid, Venous: 1.3 mmol/L (ref 0.5–1.9)

## 2018-12-09 LAB — PROCALCITONIN: Procalcitonin: 5.9 ng/mL

## 2018-12-09 LAB — MAGNESIUM: Magnesium: 2.3 mg/dL (ref 1.7–2.4)

## 2018-12-09 LAB — VITAMIN B12: Vitamin B-12: 423 pg/mL (ref 180–914)

## 2018-12-09 MED ORDER — VITAMIN B-1 100 MG PO TABS
100.0000 mg | ORAL_TABLET | Freq: Every day | ORAL | Status: DC
  Administered 2018-12-09 – 2018-12-18 (×10): 100 mg via ORAL
  Filled 2018-12-09 (×10): qty 1

## 2018-12-09 MED ORDER — OXYCODONE-ACETAMINOPHEN 5-325 MG PO TABS
1.0000 | ORAL_TABLET | Freq: Three times a day (TID) | ORAL | Status: DC | PRN
  Administered 2018-12-09 – 2018-12-10 (×3): 1 via ORAL
  Filled 2018-12-09 (×3): qty 1

## 2018-12-09 MED ORDER — THIAMINE HCL 100 MG/ML IJ SOLN: 100.0000 mg | Freq: Every day | INTRAMUSCULAR | Status: DC

## 2018-12-09 MED ORDER — ALPRAZOLAM 0.5 MG PO TABS
1.0000 mg | ORAL_TABLET | Freq: Three times a day (TID) | ORAL | Status: DC | PRN
  Administered 2018-12-09 – 2018-12-11 (×4): 1 mg via ORAL
  Filled 2018-12-09 (×5): qty 2

## 2018-12-09 MED ORDER — FOLIC ACID 1 MG PO TABS
1.0000 mg | ORAL_TABLET | Freq: Every day | ORAL | Status: DC
  Administered 2018-12-09 – 2018-12-18 (×10): 1 mg via ORAL
  Filled 2018-12-09 (×10): qty 1

## 2018-12-09 MED ORDER — SODIUM CHLORIDE 0.9% FLUSH
10.0000 mL | Freq: Two times a day (BID) | INTRAVENOUS | Status: DC
  Administered 2018-12-09 – 2018-12-11 (×6): 10 mL
  Administered 2018-12-12: 30 mL
  Administered 2018-12-13 – 2018-12-17 (×6): 10 mL

## 2018-12-09 MED ORDER — METHYLPREDNISOLONE SODIUM SUCC 40 MG IJ SOLR
40.0000 mg | Freq: Four times a day (QID) | INTRAMUSCULAR | Status: DC
  Administered 2018-12-09 (×3): 40 mg via INTRAVENOUS
  Filled 2018-12-09 (×3): qty 1

## 2018-12-09 MED ORDER — ADULT MULTIVITAMIN W/MINERALS CH
1.0000 | ORAL_TABLET | Freq: Every day | ORAL | Status: DC
  Administered 2018-12-09 – 2018-12-18 (×10): 1 via ORAL
  Filled 2018-12-09 (×10): qty 1

## 2018-12-09 MED ORDER — ALPRAZOLAM 0.25 MG PO TABS
0.2500 mg | ORAL_TABLET | Freq: Two times a day (BID) | ORAL | Status: DC | PRN
  Administered 2018-12-09: 0.25 mg via ORAL
  Filled 2018-12-09: qty 1

## 2018-12-09 MED ORDER — TRAMADOL HCL 50 MG PO TABS
100.0000 mg | ORAL_TABLET | Freq: Four times a day (QID) | ORAL | Status: DC | PRN
  Administered 2018-12-09 – 2018-12-14 (×11): 100 mg via ORAL
  Filled 2018-12-09 (×11): qty 2

## 2018-12-09 MED ORDER — CEFAZOLIN SODIUM-DEXTROSE 2-4 GM/100ML-% IV SOLN
2.0000 g | Freq: Three times a day (TID) | INTRAVENOUS | Status: DC
  Administered 2018-12-09 – 2018-12-18 (×27): 2 g via INTRAVENOUS
  Filled 2018-12-09 (×28): qty 100

## 2018-12-09 MED ORDER — SODIUM CHLORIDE 0.9 % IV BOLUS
1000.0000 mL | Freq: Once | INTRAVENOUS | Status: AC
  Administered 2018-12-09: 1000 mL via INTRAVENOUS

## 2018-12-09 MED ORDER — LORAZEPAM 1 MG PO TABS
1.0000 mg | ORAL_TABLET | Freq: Four times a day (QID) | ORAL | Status: DC | PRN
  Administered 2018-12-09 – 2018-12-10 (×3): 1 mg via ORAL
  Filled 2018-12-09 (×3): qty 1

## 2018-12-09 MED ORDER — LORAZEPAM 2 MG/ML IJ SOLN
1.0000 mg | Freq: Four times a day (QID) | INTRAMUSCULAR | Status: DC | PRN
  Filled 2018-12-09: qty 1

## 2018-12-09 MED ORDER — SODIUM CHLORIDE 0.9% FLUSH: 10.0000 mL | INTRAVENOUS | Status: DC | PRN

## 2018-12-09 MED ORDER — HYDROCORTISONE NA SUCCINATE PF 100 MG IJ SOLR
100.0000 mg | Freq: Three times a day (TID) | INTRAMUSCULAR | Status: DC
  Administered 2018-12-09 – 2018-12-11 (×6): 100 mg via INTRAVENOUS
  Filled 2018-12-09 (×6): qty 2

## 2018-12-09 NOTE — Progress Notes (Signed)
Pt having panic attack and showing withdrawal symptoms based off CIWA scoring.  Pt scored 9/10 on CIWA and previously had scored 0/10. PRN Ativan administered. Will continue to monitor.

## 2018-12-09 NOTE — TOC Initial Note (Signed)
Transition of Care [facility](TOC) - Initial/Assessment Note    Patient Details  Name: [name] MRN: [id number] Date of Birth: [birth year]  Transition of Care [facility](TOC) CM/SW Contact:    Leone Havenaylor, Fannie Alomar Clinton, RN Phone Number: 12/09/2018, 3:57 PM  Clinical Narrative:                 From home with daughter, pta indep, she states she may need ast with meds at discharge, but she may be able to pay for them , she usually get generics.  Her PCP is [facility] Mayford [facility] in [place].  She states she will call and make her a follow up apt.   Expected Discharge Plan: Home w Home Health Services Barriers to Discharge: No Barriers Identified   Patient Goals and CMS Choice Patient states their goals for this hospitalization and ongoing recovery are:: get better   Choice offered to / list presented to : NA  Expected Discharge Plan and Services Expected Discharge Plan: Home w Home Health Services In-house Referral: NA Discharge Planning Services: CM Consult Post Acute Care Choice: NA Living arrangements for the past 2 months: Single Family Home                 DME Arranged: (NA)         HH Arranged: NA          Prior Living Arrangements/Services Living arrangements for the past 2 months: Single Family Home Lives with:: Adult Children Patient language and need for interpreter reviewed:: Yes Do you feel safe going back to the place where you live?: Yes      Need for Family Participation in Patient Care: No (Comment)     Criminal Activity/Legal Involvement Pertinent to Current Situation/Hospitalization: No - Comment as needed  Activities of Daily Living Home Assistive Devices/Equipment: None ADL Screening (condition at time of admission) Patient's cognitive ability adequate to safely complete daily activities?: Yes Is the patient deaf or have difficulty hearing?: No Does the patient have difficulty seeing, even when wearing glasses/contacts?: No Does the  patient have difficulty concentrating, remembering, or making decisions?: No Patient able to express need for assistance with ADLs?: Yes Does the patient have difficulty dressing or bathing?: No Independently performs ADLs?: No Communication: Independent Dressing (OT): Needs assistance Is this a change from baseline?: Change from baseline, expected to last <3days Grooming: Needs assistance Is this a change from baseline?: Change from baseline, expected to last <3 days Feeding: Independent Bathing: Needs assistance Is this a change from baseline?: Change from baseline, expected to last <3 days Toileting: Independent In/Out Bed: Independent Walks in Home: Independent Does the patient have difficulty walking or climbing stairs?: No Weakness of Legs: Both Weakness of Arms/Hands: Both  Permission Sought/Granted                  Emotional Assessment Appearance:: Appears stated age Attitude/Demeanor/Rapport: Gracious   Orientation: : Oriented to Self, Oriented to Place, Oriented to  Time, Oriented to Situation   Psych Involvement: No (comment)  Admission diagnosis:  Hypoxic Patient Active Problem List   Diagnosis Date Noted  . COPD (chronic obstructive pulmonary disease) (HCC) 12/09/2018  . Acute on chronic respiratory failure with hypoxia (HCC) 12/08/2018  . Pneumonia 12/08/2018  . Hyponatremia 12/08/2018  . Hypokalemia 12/08/2018   PCP:  No primary care provider on file. Pharmacy:   [id number] - [place], [place] - [address] [place] [address] Phone: [phone] Fax:  [phone]     Social Determinants of Health (SDOH) Interventions    Readmission Risk Interventions Readmission Risk Prevention Plan 12/09/2018  Post Dischage Appt Complete  Medication Screening Complete  Transportation Screening Complete  Some recent data might be hidden

## 2018-12-09 NOTE — Consult Note (Signed)
[facility]    Date of Admission:  12/08/2018    Total days of antibiotics 2        Day 1 cefazolin               Reason for Consult: Automatic consultation for staph aureus bacteremia     Assessment: She has MSSA bacteremia with possible right upper lobe pneumonia.  With her acute left shoulder pain I am also concerned about septic arthritis.  Plan: 1. Continue cefazolin 2. Repeat blood cultures 3. TTE 4. May need further evaluation of left shoulder 5. Check hepatitis C viral load 6. I will provide full consultation tomorrow  Principal Problem:   Bacteremia due to methicillin susceptible Staphylococcus aureus (MSSA) Active Problems:   Pneumonia   Left shoulder pain   Acute on chronic respiratory failure with hypoxia (HCC)   Hyponatremia   Hypokalemia   COPD (chronic obstructive pulmonary disease) (HCC)   Normocytic anemia   Thrombocytopenia (HCC)   IVDU (intravenous drug user)   History of hepatitis C   Scheduled Meds: . folic acid  1 mg Oral Daily  . hydrocortisone sod succinate (SOLU-CORTEF) inj  100 mg Intravenous Q8H  . multivitamin with minerals  1 tablet Oral Daily  . sodium chloride flush  10-40 mL Intracatheter Q12H  . thiamine  100 mg Oral Daily   Or  . thiamine  100 mg Intravenous Daily   Continuous Infusions: . sodium chloride 1,000 mL (12/08/18 2251)  .  ceFAZolin (ANCEF) IV     PRN Meds:.ALPRAZolam, LORazepam **OR** LORazepam, oxyCODONE-acetaminophen, sodium chloride flush, traMADol  HPI: [name] is a 48 y.o. female was transferred [facility] from [facility] yesterday because of fever hypoxia and right upper lobe infiltrate seen on CT scan.  COVID testing there and after admission here was negative.  Admission blood cultures here are growing MSSA and 1 set.  She has a history of IVDU and hepatitis C.   Review of Systems: Review of Systems  Unable to perform ROS: Other  Constitutional:       This  consultation was performed remotely so no review of systems was performed.    Past Medical History:  Diagnosis Date  . Chronic kidney disease   . COPD (chronic obstructive pulmonary disease) (HCC)     Social History   Tobacco Use  . Smoking status: Current Some Day Smoker  . Smokeless tobacco: Never Used  Substance Use Topics  . Alcohol use: Not Currently    Comment: patient denies  . Drug use: Not Currently    Comment: patient denies    Family History  Problem Relation Age of Onset  . COPD Mother   . Leukemia Father    Allergies  Allergen Reactions  . Morphine And Related   . Zofran [Ondansetron Hcl]     OBJECTIVE: Blood pressure 93/66, pulse 72, temperature (!) 96.7 F (35.9 C), temperature source Oral, resp. rate 18, height 5\' 5"  (1.651 m), weight 61 kg, SpO2 93 %.  Physical Exam Constitutional:      Comments: This evaluation was performed remotely so no physical exam was performed.     Lab Results Lab Results  Component Value Date   WBC 3.4 (L) 12/09/2018   HGB 10.2 (L) 12/09/2018   HCT 30.0 (L) 12/09/2018   MCV 88.0 12/09/2018   PLT 45 (L) 12/09/2018    Lab Results  Component Value Date   CREATININE 0.50  12/09/2018   BUN 10 12/09/2018   NA 137 12/09/2018   K 4.3 12/09/2018   CL 102 12/09/2018   CO2 24 12/09/2018    Lab Results  Component Value Date   ALT 22 12/09/2018   AST 30 12/09/2018   ALKPHOS 48 12/09/2018   BILITOT 0.5 12/09/2018     Microbiology: Recent Results (from the past 240 hour(s))  Culture, blood (routine x 2) Call MD if unable to obtain prior to antibiotics being given     Status: None (Preliminary result)   Collection Time: 12/08/18 10:20 PM   Specimen: BLOOD LEFT HAND  Result Value Ref Range Status   Specimen Description BLOOD LEFT HAND  Final   Special Requests   Final    BOTTLES DRAWN AEROBIC ONLY Blood Culture results may not be optimal due to an inadequate volume of blood received in culture bottles   Culture   Setup Time   Final    GRAM POSITIVE COCCI IN CLUSTERS AEROBIC BOTTLE ONLY Organism ID to follow CRITICAL RESULT CALLED TO, READ BACK BY AND VERIFIED WITH: Hessie Diener. Dang PharmD 16:50 12/09/18 ([email or web address]) Performed at [facility], [address] [place], [place] [address]    Culture GRAM POSITIVE COCCI  Final   Report Status PENDING  Incomplete  Culture, blood (routine x 2) Call MD if unable to obtain prior to antibiotics being given     Status: None (Preliminary result)   Collection Time: 12/08/18 10:20 PM   Specimen: BLOOD RIGHT ARM  Result Value Ref Range Status   Specimen Description BLOOD RIGHT ARM  Final   Special Requests   Final    BOTTLES DRAWN AEROBIC ONLY Blood Culture results may not be optimal due to an inadequate volume of blood received in culture bottles   Culture   Final    NO GROWTH < 12 HOURS Performed at [facility], [address] [place], [place] [address]    Report Status PENDING  Incomplete  Blood Culture ID Panel (Reflexed)     Status: Abnormal   Collection Time: 12/08/18 10:20 PM  Result Value Ref Range Status   Enterococcus species NOT DETECTED NOT DETECTED Final   Listeria monocytogenes NOT DETECTED NOT DETECTED Final   Staphylococcus species DETECTED (A) NOT DETECTED Final    Comment: CRITICAL RESULT CALLED TO, READ BACK BY AND VERIFIED WITH: Hessie Diener. Dang PharmD 15:50 12/09/18 ([email or web address])    Staphylococcus aureus (BCID) DETECTED (A) NOT DETECTED Final    Comment: Methicillin (oxacillin) susceptible Staphylococcus aureus (MSSA). Preferred therapy is anti staphylococcal beta lactam antibiotic (Cefazolin or Nafcillin), unless clinically contraindicated. CRITICAL RESULT CALLED TO, READ BACK BY AND VERIFIED WITH: Hessie Diener. Dang PharmD 15:50 12/09/18 ([email or web address])    Methicillin resistance NOT DETECTED NOT DETECTED Final   Streptococcus species NOT DETECTED NOT DETECTED Final   Streptococcus agalactiae NOT DETECTED NOT DETECTED Final   Streptococcus pneumoniae NOT DETECTED  NOT DETECTED Final   Streptococcus pyogenes NOT DETECTED NOT DETECTED Final   Acinetobacter baumannii NOT DETECTED NOT DETECTED Final   Enterobacteriaceae species NOT DETECTED NOT DETECTED Final   Enterobacter cloacae complex NOT DETECTED NOT DETECTED Final   Escherichia coli NOT DETECTED NOT DETECTED Final   Klebsiella oxytoca NOT DETECTED NOT DETECTED Final   Klebsiella pneumoniae NOT DETECTED NOT DETECTED Final   Proteus species NOT DETECTED NOT DETECTED Final   Serratia marcescens NOT DETECTED NOT DETECTED Final   Haemophilus influenzae NOT DETECTED NOT DETECTED Final   Neisseria meningitidis NOT DETECTED  NOT DETECTED Final   Pseudomonas aeruginosa NOT DETECTED NOT DETECTED Final   Candida albicans NOT DETECTED NOT DETECTED Final   Candida glabrata NOT DETECTED NOT DETECTED Final   Candida krusei NOT DETECTED NOT DETECTED Final   Candida parapsilosis NOT DETECTED NOT DETECTED Final   Candida tropicalis NOT DETECTED NOT DETECTED Final    Comment: Performed at [facility], [address] [place], [place] [address]  SARS Coronavirus 2 (CEPHEID- Performed in [facility] hospital lab), Hosp Order     Status: None   Collection Time: 12/08/18 11:45 PM   Specimen: Nasopharyngeal Swab  Result Value Ref Range Status   SARS Coronavirus 2 NEGATIVE NEGATIVE Final    Comment: (NOTE) If result is NEGATIVE SARS-CoV-2 target nucleic acids are NOT DETECTED. The SARS-CoV-2 RNA is generally detectable in upper and lower  respiratory specimens during the acute phase of infection. The lowest  concentration of SARS-CoV-2 viral copies this assay can detect is 250  copies / mL. A negative result does not preclude SARS-CoV-2 infection  and should not be used as the sole basis for treatment or other  patient management decisions.  A negative result may occur with  improper specimen collection / handling, submission of specimen other  than nasopharyngeal swab, presence of viral mutation(s) within  the  areas targeted by this assay, and inadequate number of viral copies  (<250 copies / mL). A negative result must be combined with clinical  observations, patient history, and epidemiological information. If result is POSITIVE SARS-CoV-2 target nucleic acids are DETECTED. The SARS-CoV-2 RNA is generally detectable in upper and lower  respiratory specimens dur ing the acute phase of infection.  Positive  results are indicative of active infection with SARS-CoV-2.  Clinical  correlation with patient history and other diagnostic information is  necessary to determine patient infection status.  Positive results do  not rule out bacterial infection or co-infection with other viruses. If result is PRESUMPTIVE POSTIVE SARS-CoV-2 nucleic acids MAY BE PRESENT.   A presumptive positive result was obtained on the submitted specimen  and confirmed on repeat testing.  While 2019 novel coronavirus  (SARS-CoV-2) nucleic acids may be present in the submitted sample  additional confirmatory testing may be necessary for epidemiological  and / or clinical management purposes  to differentiate between  SARS-CoV-2 and other Sarbecovirus currently known to infect humans.  If clinically indicated additional testing with an alternate test  methodology [phone]) is advised. The SARS-CoV-2 RNA is generally  detectable in upper and lower respiratory sp ecimens during the acute  phase of infection. The expected result is Negative. Fact Sheet for Patients:  BoilerBrush.com.cyhttps://www.fda.gov/media/136312/download Fact Sheet for Healthcare Providers: https://pope.com/https://www.fda.gov/media/136313/download This test is not yet approved or cleared by the [place] FDA and has been authorized for detection and/or diagnosis of SARS-CoV-2 by FDA under an Emergency Use Authorization (EUA).  This EUA will remain in effect (meaning this test can be used) for the duration of the COVID-19 declaration under Section 564(b)(1) of the Act, 21  U.S.C. section 360bbb-3(b)(1), unless the authorization is terminated or revoked sooner. Performed at [facility], [address] [place], [place] [address]   MRSA PCR Screening     Status: None   Collection Time: 12/09/18 12:10 AM   Specimen: Nasopharyngeal  Result Value Ref Range Status   MRSA by PCR NEGATIVE NEGATIVE Final    Comment:        The GeneXpert MRSA Assay (FDA approved for NASAL specimens  only), is one component of a comprehensive MRSA colonization surveillance program. It is not intended to diagnose MRSA infection nor to guide or monitor treatment for MRSA infections. Performed at [facility], [place] [address], [place], [place] [address]     Michel Bickers, [place] [facility] [phone] pager   [phone] cell 12/09/2018, 5:04 PM

## 2018-12-09 NOTE — Progress Notes (Addendum)
PROGRESS NOTE    [name]  ZOX:[id number] DOB: 25-Feb-1971 DOA: 12/08/2018 PCP: No primary care provider on file.    Brief Narrative:   [name] is a 48 y.o. female with medical history significant of IV drug abuse, emphysema who presented to [facility] with acute respiratory failure and hypoxemia.   Assessment & Plan:   Principal Problem:   Acute on chronic respiratory failure with hypoxia (HCC) Active Problems:   Pneumonia   Hyponatremia   Hypokalemia   COPD (chronic obstructive pulmonary disease) (HCC)   Acute respiratory failure with hypoxia Secondary to community acquired pneumonia/ right upper lobe pneumonia.  CT angio negative for PE.  Continue with IV antibiotics and IV solumedrol.  [place] oxygen to keep sats greater tan 90%.  Pt is currently not wheezing and plan to taper the steroids in the next 24 to 48 hours.     Hypokalemia: replaced.   Hypomagnesemia:  Replaced.    Anemia of chronic disease:  Anemia panel ordered.    Thrombocytopenia:  Probably from hepatitis C.  No signs of bleeding.     Hyponatremia:  Resolved with hydration.    Heavy alcohol abuse:  Will start her on ciwa.  No signs of withdrawal at this time.    Cocaine abuse UDS ordered.    Chronic pain syndrome:  Pt reports she is on oxycodone, which I do not see on her AVS.    H/o hepatitis C:  Outpatient follow up   DVT prophylaxis: scd's Code Status: full code.  Family Communication: none at bedside.  Disposition Plan: pending clinical improvement.    Consultants:   None.   Procedures: none.   Antimicrobials: rocephin and zithromax since admission.   Subjective: Reports left shoulder pain , unable to move the left arm from pain. Requesting xanax and oxycodone.    Objective: Vitals:   12/08/18 2030 12/09/18 0015 12/09/18 0835 12/09/18 1100  BP: 114/70 90/78 (!) 83/43 (!) 85/55  Pulse: (!) 110 (!) 116    Resp: 20  12   Temp: (!)  103.3 F (39.6 C) 99.6 F (37.6 C) (!) 96.7 F (35.9 C)   TempSrc: Oral Oral Oral   SpO2: 96% 100% 100% 93%  Weight: 61 kg     Height: 5\' 5"  (1.651 m)       Intake/Output Summary (Last 24 hours) at 12/09/2018 1146 Last data filed at 12/09/2018 0800 Gross per 24 hour  Intake 1584.66 ml  Output -  Net 1584.66 ml   Filed Weights   12/08/18 2000 12/08/18 2030  Weight: 61.7 kg 61 kg    Examination:  General exam: in mild distress from pain.  Respiratory system: Clear to auscultation. Respiratory effort normal. Cardiovascular system: S1 & S2 heard, RRR.  Gastrointestinal system: Abdomen is nondistended, soft and nontender. No organomegaly or masses felt. Normal bowel sounds heard. Central nervous system: Alert and oriented. No focal neurological deficits. Extremities: no pedal edema. Unable to move the left arm from pain.  Skin: No rashes, lesions or ulcers Psychiatry:  Anxious.     Data Reviewed: I have personally reviewed following labs and imaging studies  CBC: Recent Labs  Lab 12/08/18 2223 12/09/18 0946  WBC 4.3 3.4*  NEUTROABS 3.5  --   HGB 11.1* 10.2*  HCT 32.8* 30.0*  MCV 89.1 88.0  PLT 53* 45*   Basic Metabolic Panel: Recent Labs  Lab 12/08/18 2223 12/09/18 0946  NA 130* 137  K 3.2* 4.3  CL 93* 102  CO2 26 24  GLUCOSE 122* 158*  BUN 8 10  CREATININE 0.78 0.50  CALCIUM 8.1* 8.1*  MG  --  2.3   GFR: Estimated Creatinine Clearance: 79.9 mL/min (by C-G formula based on SCr of 0.5 mg/dL). Liver Function Tests: Recent Labs  Lab 12/08/18 2223 12/09/18 0946  AST 21 30  ALT 18 22  ALKPHOS 46 48  BILITOT 1.0 0.5  PROT 7.0 6.2*  ALBUMIN 3.2* 2.7*   No results for input(s): LIPASE, AMYLASE in the last 168 hours. No results for input(s): AMMONIA in the last 168 hours. Coagulation Profile: No results for input(s): INR, PROTIME in the last 168 hours. Cardiac Enzymes: No results for input(s): CKTOTAL, CKMB, CKMBINDEX, TROPONINI in the last 168 hours.  BNP (last 3 results) No results for input(s): PROBNP in the last 8760 hours. HbA1C: No results for input(s): HGBA1C in the last 72 hours. CBG: Recent Labs  Lab 12/08/18 2235  GLUCAP 110*   Lipid Profile: No results for input(s): CHOL, HDL, LDLCALC, TRIG, CHOLHDL, LDLDIRECT in the last 72 hours. Thyroid Function Tests: No results for input(s): TSH, T4TOTAL, FREET4, T3FREE, THYROIDAB in the last 72 hours. Anemia Panel: No results for input(s): VITAMINB12, FOLATE, FERRITIN, TIBC, IRON, RETICCTPCT in the last 72 hours. Sepsis Labs: No results for input(s): PROCALCITON, LATICACIDVEN in the last 168 hours.  Recent Results (from the past 240 hour(s))  Culture, blood (routine x 2) Call MD if unable to obtain prior to antibiotics being given     Status: None (Preliminary result)   Collection Time: 12/08/18 10:20 PM   Specimen: BLOOD LEFT HAND  Result Value Ref Range Status   Specimen Description BLOOD LEFT HAND  Final   Special Requests   Final    BOTTLES DRAWN AEROBIC ONLY Blood Culture results may not be optimal due to an inadequate volume of blood received in culture bottles   Culture   Final    NO GROWTH < 12 HOURS Performed at [facility], [address] [place], [place] [address]    Report Status PENDING  Incomplete  Culture, blood (routine x 2) Call MD if unable to obtain prior to antibiotics being given     Status: None (Preliminary result)   Collection Time: 12/08/18 10:20 PM   Specimen: BLOOD RIGHT ARM  Result Value Ref Range Status   Specimen Description BLOOD RIGHT ARM  Final   Special Requests   Final    BOTTLES DRAWN AEROBIC ONLY Blood Culture results may not be optimal due to an inadequate volume of blood received in culture bottles   Culture   Final    NO GROWTH < 12 HOURS Performed at [facility], [address] [place], [place] [address]    Report Status PENDING  Incomplete  SARS Coronavirus 2 (CEPHEID- Performed in [facility] hospital lab), Hosp  Order     Status: None   Collection Time: 12/08/18 11:45 PM   Specimen: Nasopharyngeal Swab  Result Value Ref Range Status   SARS Coronavirus 2 NEGATIVE NEGATIVE Final    Comment: (NOTE) If result is NEGATIVE SARS-CoV-2 target nucleic acids are NOT DETECTED. The SARS-CoV-2 RNA is generally detectable in upper and lower  respiratory specimens during the acute phase of infection. The lowest  concentration of SARS-CoV-2 viral copies this assay can detect is 250  copies / mL. A negative result does not preclude SARS-CoV-2 infection  and should not be used as the sole basis for treatment or other  patient management decisions.  A negative result may occur with  improper specimen collection / handling, submission of specimen other  than nasopharyngeal swab, presence of viral mutation(s) within the  areas targeted by this assay, and inadequate number of viral copies  (<250 copies / mL). A negative result must be combined with clinical  observations, patient history, and epidemiological information. If result is POSITIVE SARS-CoV-2 target nucleic acids are DETECTED. The SARS-CoV-2 RNA is generally detectable in upper and lower  respiratory specimens dur ing the acute phase of infection.  Positive  results are indicative of active infection with SARS-CoV-2.  Clinical  correlation with patient history and other diagnostic information is  necessary to determine patient infection status.  Positive results do  not rule out bacterial infection or co-infection with other viruses. If result is PRESUMPTIVE POSTIVE SARS-CoV-2 nucleic acids MAY BE PRESENT.   A presumptive positive result was obtained on the submitted specimen  and confirmed on repeat testing.  While 2019 novel coronavirus  (SARS-CoV-2) nucleic acids may be present in the submitted sample  additional confirmatory testing may be necessary for epidemiological  and / or clinical management purposes  to differentiate between  SARS-CoV-2  and other Sarbecovirus currently known to infect humans.  If clinically indicated additional testing with an alternate test  methodology [phone]([id number]) is advised. The SARS-CoV-2 RNA is generally  detectable in upper and lower respiratory sp ecimens during the acute  phase of infection. The expected result is Negative. Fact Sheet for Patients:  BoilerBrush.com.cyhttps://www.fda.gov/media/136312/download Fact Sheet for Healthcare Providers: https://pope.com/https://www.fda.gov/media/136313/download This test is not yet approved or cleared by the [place] FDA and has been authorized for detection and/or diagnosis of SARS-CoV-2 by FDA under an Emergency Use Authorization (EUA).  This EUA will remain in effect (meaning this test can be used) for the duration of the COVID-19 declaration under Section 564(b)(1) of the Act, 21 U.S.C. section 360bbb-3(b)(1), unless the authorization is terminated or revoked sooner. Performed at [facility], [address] [place], [place] [address]   MRSA PCR Screening     Status: None   Collection Time: 12/09/18 12:10 AM   Specimen: Nasopharyngeal  Result Value Ref Range Status   MRSA by PCR NEGATIVE NEGATIVE Final    Comment:        The GeneXpert MRSA Assay (FDA approved for NASAL specimens only), is one component of a comprehensive MRSA colonization surveillance program. It is not intended to diagnose MRSA infection nor to guide or monitor treatment for MRSA infections. Performed at [facility], [address] [place], [place] [address]          Radiology Studies: No results found.      Scheduled Meds: . enoxaparin (LOVENOX) injection  40 mg Subcutaneous Q24H  . methylPREDNISolone (SOLU-MEDROL) injection  40 mg Intravenous Q6H  . sodium chloride flush  10-40 mL Intracatheter Q12H   Continuous Infusions: . sodium chloride 1,000 mL (12/08/18 2251)  . azithromycin 500 mg (12/08/18 2350)  . cefTRIAXone (ROCEPHIN)  IV 2 g (12/08/18 2256)  . sodium  chloride       LOS: 1 day    Time spent: 35 minutes.     Kathlen ModyVijaya Maxum Cassarino, MD [facility] Pager [phone]   If 7PM-7AM, please contact night-coverage [email or web address] Password [email or web address] 12/09/2018, 11:46 AM    Addendum: pts blood cultures revealed MSSA, ordered echo, narrow antibiotics to ancef. As CXR does not show pneumonia.  Will repeat blood cultures in am.  Kathlen ModyVijaya Locke Barrell, MD  [phone] 

## 2018-12-09 NOTE — Progress Notes (Signed)
Pharmacy Antibiotic Note  [name] is a 48 y.o. female admitted on 12/08/2018 with SOB.  Patient has a history of IVDU.  Blood culture growing MSSA in 1 of 2 bottles thus far.  Pharmacy has been consulted to narrow antibiotics to Ancef.  Currently hypotensive.  SCr 0.5, CrCL 80 ml/min, currently afebrile (Tmax 103 on admit), WBC 3.4.  Plan: Ancef 2gm IV Q8H Monitor renal fxn, repeat blood cultures, possible TTE/TEE  Height: 5\' 5"  (165.1 cm) Weight: 134 lb 7.7 oz (61 kg) IBW/kg (Calculated) : 57  Temp (24hrs), Avg:100.7 F (38.2 C), Min:96.7 F (35.9 C), Max:103.3 F (39.6 C)  Recent Labs  Lab 12/08/18 2223 12/09/18 0946 12/09/18 1252  WBC 4.3 3.4*  --   CREATININE 0.78 0.50  --   LATICACIDVEN  --   --  1.3    Estimated Creatinine Clearance: 79.9 mL/min (by C-G formula based on SCr of 0.5 mg/dL).    Allergies  Allergen Reactions  . Morphine And Related   . Zofran [Ondansetron Hcl]     Azith 7/2 >> 7/3 CTX 7/2 >> 7/3 Ancef 7/3 >>  7/2 covid - negative 7/2 MRSA PCR - negative 7/2 BCx - GPC 1 of 2 (BCID MSSA)  Cevin Rubinstein D. Mina Marble, PharmD, BCPS, [place] 12/09/2018, 4:32 PM

## 2018-12-09 NOTE — Progress Notes (Signed)
PHARMACY - PHYSICIAN COMMUNICATION CRITICAL VALUE ALERT - BLOOD CULTURE IDENTIFICATION (BCID)  [name] is an 48 y.o. female who presented to [facility] on 12/08/2018 with a chief complaint of SOB.  Assessment:  History of IVDU started on antibiotics for suspected COPD exacerbation and PNA.  CXR negative.  BCID shows MSSA in 1 of 2 bottles thus far.  Febrile on admit.  Name of physician (or Provider) Contacted: Dr. Ricky Ala  Current antibiotics: Rocephin and azithromycin  Changes to prescribed antibiotics recommended:  Recommendations accepted by provider  Narrow abx to Ancef Repeat BCx, may need TTE/TEE  Results for orders placed or performed during the hospital encounter of 12/08/18  Blood Culture ID Panel (Reflexed) (Collected: 12/08/2018 10:20 PM)  Result Value Ref Range   Enterococcus species NOT DETECTED NOT DETECTED   Listeria monocytogenes NOT DETECTED NOT DETECTED   Staphylococcus species DETECTED (A) NOT DETECTED   Staphylococcus aureus (BCID) DETECTED (A) NOT DETECTED   Methicillin resistance NOT DETECTED NOT DETECTED   Streptococcus species NOT DETECTED NOT DETECTED   Streptococcus agalactiae NOT DETECTED NOT DETECTED   Streptococcus pneumoniae NOT DETECTED NOT DETECTED   Streptococcus pyogenes NOT DETECTED NOT DETECTED   Acinetobacter baumannii NOT DETECTED NOT DETECTED   Enterobacteriaceae species NOT DETECTED NOT DETECTED   Enterobacter cloacae complex NOT DETECTED NOT DETECTED   Escherichia coli NOT DETECTED NOT DETECTED   Klebsiella oxytoca NOT DETECTED NOT DETECTED   Klebsiella pneumoniae NOT DETECTED NOT DETECTED   Proteus species NOT DETECTED NOT DETECTED   Serratia marcescens NOT DETECTED NOT DETECTED   Haemophilus influenzae NOT DETECTED NOT DETECTED   Neisseria meningitidis NOT DETECTED NOT DETECTED   Pseudomonas aeruginosa NOT DETECTED NOT DETECTED   Candida albicans NOT DETECTED NOT DETECTED   Candida glabrata NOT DETECTED NOT DETECTED   Candida krusei NOT DETECTED NOT DETECTED   Candida parapsilosis NOT DETECTED NOT DETECTED   Candida tropicalis NOT DETECTED NOT DETECTED    Sadaf Przybysz D. Mina Marble, PharmD, BCPS, [place] 12/09/2018, 4:29 PM

## 2018-12-10 ENCOUNTER — Inpatient Hospital Stay (HOSPITAL_COMMUNITY): Payer: Medicaid Other

## 2018-12-10 ENCOUNTER — Other Ambulatory Visit (HOSPITAL_COMMUNITY): Payer: Self-pay

## 2018-12-10 DIAGNOSIS — Z888 Allergy status to other drugs, medicaments and biological substances status: Secondary | ICD-10-CM

## 2018-12-10 DIAGNOSIS — Z8619 Personal history of other infectious and parasitic diseases: Secondary | ICD-10-CM

## 2018-12-10 DIAGNOSIS — I361 Nonrheumatic tricuspid (valve) insufficiency: Secondary | ICD-10-CM

## 2018-12-10 DIAGNOSIS — B9561 Methicillin susceptible Staphylococcus aureus infection as the cause of diseases classified elsewhere: Secondary | ICD-10-CM

## 2018-12-10 DIAGNOSIS — Z885 Allergy status to narcotic agent status: Secondary | ICD-10-CM

## 2018-12-10 DIAGNOSIS — M25512 Pain in left shoulder: Secondary | ICD-10-CM

## 2018-12-10 DIAGNOSIS — K0889 Other specified disorders of teeth and supporting structures: Secondary | ICD-10-CM

## 2018-12-10 DIAGNOSIS — J449 Chronic obstructive pulmonary disease, unspecified: Secondary | ICD-10-CM

## 2018-12-10 DIAGNOSIS — J15211 Pneumonia due to Methicillin susceptible Staphylococcus aureus: Secondary | ICD-10-CM

## 2018-12-10 DIAGNOSIS — R7881 Bacteremia: Secondary | ICD-10-CM

## 2018-12-10 LAB — ECHOCARDIOGRAM COMPLETE
Height: 65 in
Weight: 2151.69 oz

## 2018-12-10 MED ORDER — OXYCODONE-ACETAMINOPHEN 5-325 MG PO TABS
2.0000 | ORAL_TABLET | Freq: Three times a day (TID) | ORAL | Status: DC | PRN
  Administered 2018-12-10 – 2018-12-12 (×5): 2 via ORAL
  Filled 2018-12-10 (×5): qty 2

## 2018-12-10 MED ORDER — LORAZEPAM 2 MG/ML IJ SOLN
1.0000 mg | INTRAMUSCULAR | Status: DC | PRN
  Administered 2018-12-10 (×2): 1 mg via INTRAVENOUS
  Filled 2018-12-10: qty 1

## 2018-12-10 MED ORDER — IPRATROPIUM-ALBUTEROL 0.5-2.5 (3) MG/3ML IN SOLN
3.0000 mL | Freq: Four times a day (QID) | RESPIRATORY_TRACT | Status: DC
  Administered 2018-12-10 – 2018-12-12 (×5): 3 mL via RESPIRATORY_TRACT
  Filled 2018-12-10 (×7): qty 3

## 2018-12-10 MED ORDER — GUAIFENESIN 100 MG/5ML PO SOLN
5.0000 mL | ORAL | Status: DC | PRN
  Administered 2018-12-10 – 2018-12-14 (×3): 100 mg via ORAL
  Filled 2018-12-10 (×4): qty 5

## 2018-12-10 MED ORDER — LORAZEPAM 1 MG PO TABS
1.0000 mg | ORAL_TABLET | Freq: Four times a day (QID) | ORAL | Status: DC | PRN
  Administered 2018-12-12 – 2018-12-14 (×3): 1 mg via ORAL
  Filled 2018-12-10 (×3): qty 1

## 2018-12-10 NOTE — Progress Notes (Addendum)
Patient ID: Maria BirminghamMisty Chandler Chandler, female   DOB: 1970/07/22, 48 y.o.   MRN: [id number]         [facility]  Date of Admission:  12/08/2018           Day 2 cefazolin ASSESSMENT: She has pneumonia complicated by MSSA bacteremia.  Both [facility] cultures done [facility] are positive and 1 of 2 admission blood cultures here is positive.  Repeat blood cultures are pending.  There is no evidence of endocarditis by TTE.  I would recommend TEE to help evaluate optimal duration of therapy.  I was able to locate the results of hepatitis C antibodies and care everywhere.  She was antibody positive in March 2017 but there was no detectable virus indicative of spontaneous clearing.  She does not need treatment.  PLAN: 1. Continue cefazolin 2. Await results of repeat blood cultures 3. Hold off on PICC placement 4. Recommend TEE 5. I will follow-up on 12/12/2018  Principal Problem:   Bacteremia due to methicillin susceptible Staphylococcus aureus (MSSA) Active Problems:   Pneumonia   Left shoulder pain   Acute on chronic respiratory failure with hypoxia (HCC)   Hyponatremia   Hypokalemia   COPD (chronic obstructive pulmonary disease) (HCC)   Normocytic anemia   Thrombocytopenia (HCC)   IVDU (intravenous drug user)   History of hepatitis C   Scheduled Meds: . folic acid  1 mg Oral Daily  . hydrocortisone sod succinate (SOLU-CORTEF) inj  100 mg Intravenous Q8H  . ipratropium-albuterol  3 mL Nebulization Q6H  . multivitamin with minerals  1 tablet Oral Daily  . sodium chloride flush  10-40 mL Intracatheter Q12H  . thiamine  100 mg Oral Daily   Continuous Infusions: . sodium chloride 100 mL/hr at 12/10/18 0636  .  ceFAZolin (ANCEF) IV 2 g (12/10/18 0606)   PRN Meds:.ALPRAZolam, guaiFENesin, LORazepam **OR** LORazepam, oxyCODONE-acetaminophen, sodium chloride flush, traMADol   SUBJECTIVE: She says that she has been sick with worsening cough and  fever for several weeks.  She was afraid to come to the hospital because she thought she might be told that she has COVID-19.  She has underlying COPD.  She tells me that she has a history of hepatitis C and was told years ago that she did not need treatment.  She said that she used to inject drugs but quit completely once she learned that she was pregnant with her 46-year-old daughter.  She says that she stumbled and fell hitting her left shoulder on the wall of her apartment recently.  It is been very painful since that time.  Review of Systems: Review of Systems  Constitutional: Negative for chills, diaphoresis and fever.  Respiratory: Positive for cough, shortness of breath and wheezing. Negative for sputum production.   Cardiovascular: Negative for chest pain.  Gastrointestinal: Negative for abdominal pain, diarrhea, nausea and vomiting.  Genitourinary: Negative for dysuria.  Musculoskeletal: Positive for joint pain.  Neurological: Negative for headaches.  Psychiatric/Behavioral: Negative for depression. The patient is nervous/anxious.     Allergies  Allergen Reactions  . Morphine And Related   . Zofran [Ondansetron Hcl]     OBJECTIVE: Vitals:   12/09/18 1900 12/09/18 2318 12/10/18 0130 12/10/18 0751  BP: 112/62 116/65 116/65 94/71  Pulse: (!) 55  70 72  Resp: 17 18    Temp: 98.6 F (37 C) 98.6 F (37 C)    TempSrc: Oral Oral    SpO2: 93% 94%  95%  Weight:      Height:       Body mass index is 22.38 kg/m.  Physical Exam Constitutional:      Comments: She is sitting up in bed.  She is very talkative and slightly anxious.  She requested increase medication for anxiety and pain.  HENT:     Mouth/Throat:     Comments: Many broken or missing teeth. Eyes:     Conjunctiva/sclera: Conjunctivae normal.  Cardiovascular:     Rate and Rhythm: Normal rate and regular rhythm.     Heart sounds: No murmur.     Comments: Distant heart sounds. Pulmonary:     Effort: Pulmonary  effort is normal.     Breath sounds: Wheezing and rales present.  Abdominal:     Palpations: Abdomen is soft.     Tenderness: There is no abdominal tenderness.  Musculoskeletal:        General: Tenderness present. No swelling.     Comments: There is tenderness with palpation of her anterior left shoulder.  There is no unusual warmth, redness or swelling.  Skin:    Findings: No rash.     Lab Results Lab Results  Component Value Date   WBC 3.4 (L) 12/09/2018   HGB 10.2 (L) 12/09/2018   HCT 30.0 (L) 12/09/2018   MCV 88.0 12/09/2018   PLT 45 (L) 12/09/2018    Lab Results  Component Value Date   CREATININE 0.50 12/09/2018   BUN 10 12/09/2018   NA 137 12/09/2018   K 4.3 12/09/2018   CL 102 12/09/2018   CO2 24 12/09/2018    Lab Results  Component Value Date   ALT 22 12/09/2018   AST 30 12/09/2018   ALKPHOS 48 12/09/2018   BILITOT 0.5 12/09/2018     Microbiology: Recent Results (from the past 240 hour(s))  Culture, blood (routine x 2) Call MD if unable to obtain prior to antibiotics being given     Status: None (Preliminary result)   Collection Time: 12/08/18 10:20 PM   Specimen: BLOOD LEFT HAND  Result Value Ref Range Status   Specimen Description BLOOD LEFT HAND  Final   Special Requests   Final    BOTTLES DRAWN AEROBIC ONLY Blood Culture results may not be optimal due to an inadequate volume of blood received in culture bottles   Culture  Setup Time   Final    GRAM POSITIVE COCCI IN CLUSTERS AEROBIC BOTTLE ONLY CRITICAL RESULT CALLED TO, READ BACK BY AND VERIFIED WITH: Hessie Diener. Dang PharmD 16:50 12/09/18 ([email or web address]) Performed at [facility], [address] [place], [place] [address]    Culture GRAM POSITIVE COCCI  Final   Report Status PENDING  Incomplete  Culture, blood (routine x 2) Call MD if unable to obtain prior to antibiotics being given     Status: None (Preliminary result)   Collection Time: 12/08/18 10:20 PM   Specimen: BLOOD RIGHT ARM  Result Value Ref  Range Status   Specimen Description BLOOD RIGHT ARM  Final   Special Requests   Final    BOTTLES DRAWN AEROBIC ONLY Blood Culture results may not be optimal due to an inadequate volume of blood received in culture bottles   Culture   Final    NO GROWTH < 12 HOURS Performed at [facility], [address] [place], [place] [address]    Report Status PENDING  Incomplete  Blood Culture ID Panel (Reflexed)     Status: Abnormal  Collection Time: 12/08/18 10:20 PM  Result Value Ref Range Status   Enterococcus species NOT DETECTED NOT DETECTED Final   Listeria monocytogenes NOT DETECTED NOT DETECTED Final   Staphylococcus species DETECTED (A) NOT DETECTED Final    Comment: CRITICAL RESULT CALLED TO, READ BACK BY AND VERIFIED WITH: Diona Browner PharmD 15:50 12/09/18 ([email or web address])    Staphylococcus aureus (BCID) DETECTED (A) NOT DETECTED Final    Comment: Methicillin (oxacillin) susceptible Staphylococcus aureus (MSSA). Preferred therapy is anti staphylococcal beta lactam antibiotic (Cefazolin or Nafcillin), unless clinically contraindicated. CRITICAL RESULT CALLED TO, READ BACK BY AND VERIFIED WITH: Diona Browner PharmD 15:50 12/09/18 ([email or web address])    Methicillin resistance NOT DETECTED NOT DETECTED Final   Streptococcus species NOT DETECTED NOT DETECTED Final   Streptococcus agalactiae NOT DETECTED NOT DETECTED Final   Streptococcus pneumoniae NOT DETECTED NOT DETECTED Final   Streptococcus pyogenes NOT DETECTED NOT DETECTED Final   Acinetobacter baumannii NOT DETECTED NOT DETECTED Final   Enterobacteriaceae species NOT DETECTED NOT DETECTED Final   Enterobacter cloacae complex NOT DETECTED NOT DETECTED Final   Escherichia coli NOT DETECTED NOT DETECTED Final   Klebsiella oxytoca NOT DETECTED NOT DETECTED Final   Klebsiella pneumoniae NOT DETECTED NOT DETECTED Final   Proteus species NOT DETECTED NOT DETECTED Final   Serratia marcescens NOT DETECTED NOT DETECTED Final   Haemophilus influenzae NOT  DETECTED NOT DETECTED Final   Neisseria meningitidis NOT DETECTED NOT DETECTED Final   Pseudomonas aeruginosa NOT DETECTED NOT DETECTED Final   Candida albicans NOT DETECTED NOT DETECTED Final   Candida glabrata NOT DETECTED NOT DETECTED Final   Candida krusei NOT DETECTED NOT DETECTED Final   Candida parapsilosis NOT DETECTED NOT DETECTED Final   Candida tropicalis NOT DETECTED NOT DETECTED Final    Comment: Performed at [facility], [place] [address], [place], [place] [address]  SARS Coronavirus 2 (CEPHEID- Performed in [place] hospital lab), Hosp Order     Status: None   Collection Time: 12/08/18 11:45 PM   Specimen: Nasopharyngeal Swab  Result Value Ref Range Status   SARS Coronavirus 2 NEGATIVE NEGATIVE Final    Comment: (NOTE) If result is NEGATIVE SARS-CoV-2 target nucleic acids are NOT DETECTED. The SARS-CoV-2 RNA is generally detectable in upper and lower  respiratory specimens during the acute phase of infection. The lowest  concentration of SARS-CoV-2 viral copies this assay can detect is 250  copies / mL. A negative result does not preclude SARS-CoV-2 infection  and should not be used as the sole basis for treatment or other  patient management decisions.  A negative result may occur with  improper specimen collection / handling, submission of specimen other  than nasopharyngeal swab, presence of viral mutation(s) within the  areas targeted by this assay, and inadequate number of viral copies  (<250 copies / mL). A negative result must be combined with clinical  observations, patient history, and epidemiological information. If result is POSITIVE SARS-CoV-2 target nucleic acids are DETECTED. The SARS-CoV-2 RNA is generally detectable in upper and lower  respiratory specimens dur ing the acute phase of infection.  Positive  results are indicative of active infection with SARS-CoV-2.  Clinical  correlation with patient history and other diagnostic information is   necessary to determine patient infection status.  Positive results do  not rule out bacterial infection or co-infection with other viruses. If result is PRESUMPTIVE POSTIVE SARS-CoV-2 nucleic acids MAY BE PRESENT.   A presumptive positive result was obtained on the submitted specimen  and confirmed on repeat testing.  While 2019 novel coronavirus  (SARS-CoV-2) nucleic acids may be present in the submitted sample  additional confirmatory testing may be necessary for epidemiological  and / or clinical management purposes  to differentiate between  SARS-CoV-2 and other Sarbecovirus currently known to infect humans.  If clinically indicated additional testing with an alternate test  methodology [phone]([id number]) is advised. The SARS-CoV-2 RNA is generally  detectable in upper and lower respiratory sp ecimens during the acute  phase of infection. The expected result is Negative. Fact Sheet for Patients:  BoilerBrush.com.cyhttps://www.fda.gov/media/136312/download Fact Sheet for Healthcare Providers: https://pope.com/https://www.fda.gov/media/136313/download This test is not yet approved or cleared by the [place] FDA and has been authorized for detection and/or diagnosis of SARS-CoV-2 by FDA under an Emergency Use Authorization (EUA).  This EUA will remain in effect (meaning this test can be used) for the duration of the COVID-19 declaration under Section 564(b)(1) of the Act, 21 U.S.C. section 360bbb-3(b)(1), unless the authorization is terminated or revoked sooner. Performed at [facility], [address] [place], [place] [address]   MRSA PCR Screening     Status: None   Collection Time: 12/09/18 12:10 AM   Specimen: Nasopharyngeal  Result Value Ref Range Status   MRSA by PCR NEGATIVE NEGATIVE Final    Comment:        The GeneXpert MRSA Assay (FDA approved for NASAL specimens only), is one component of a comprehensive MRSA colonization surveillance program. It is not intended to diagnose MRSA infection nor  to guide or monitor treatment for MRSA infections. Performed at [facility], [address] [place], [place] [address]     Cliffton AstersJohn Lenita Peregrina, MD [facility] [phone] pager   [phone] cell 12/10/2018, 2:00 PM

## 2018-12-10 NOTE — Progress Notes (Signed)
Pt asking for her inhalers that she takes at home. Auscultated lung sounds wheezing bilaterally upper bases.  MD paged

## 2018-12-10 NOTE — Progress Notes (Signed)
Per telemetry report pt Sinus brady 50 bpm.

## 2018-12-10 NOTE — Progress Notes (Signed)
PROGRESS NOTE    [name]  ZJI:[id number] DOB: July 28, 1970 DOA: 12/08/2018 PCP: No primary care provider on file.    Brief Narrative:   [name] is a 48 y.o. female with medical history significant of IV drug abuse, emphysema who presented to [facility] with acute respiratory failure and hypoxemia.   Assessment & Plan:   Principal Problem:   Bacteremia due to methicillin susceptible Staphylococcus aureus (MSSA) Active Problems:   Acute on chronic respiratory failure with hypoxia (HCC)   Pneumonia   Hyponatremia   Hypokalemia   COPD (chronic obstructive pulmonary disease) (HCC)   Left shoulder pain   Normocytic anemia   Thrombocytopenia (HCC)   IVDU (intravenous drug user)   History of hepatitis C   Acute respiratory failure with hypoxia Secondary to community acquired pneumonia/ right upper lobe pneumonia.  CT angio negative for PE. Repeat CXR does not show any pneumonia.  Continue with IV antibiotics and bronchodilators as needed.  She was weaned off oxygen yesterday evening.  She reports some wheezing today but her oxygen sats are good.    MSSA bacteremia:  On ancef, TTE does not show any endocarditis.  Repeat cultures are pending.  TEE will be requested which probably will be done Monday or Tuesday.   Hypokalemia: replaced.   Hypomagnesemia:  Replaced.    Anemia of chronic disease:  Anemia panel ordered. Shows low iron levels, will add iron supplementation.    Thrombocytopenia:  Probably from hepatitis C.  No signs of bleeding.     Hyponatremia:  Resolved with hydration.    Heavy alcohol abuse:  With some withdrawal symptoms.  CIWA on board.    Cocaine abuse UDS ordered, positive for benzodiazepines and opiates. Will get CSW for resources.    Chronic pain syndrome:  Pt reports she is on oxycodone, which I do not see on her AVS.  Will request pharmacy to confirm the dose of the oxycodone.    H/o hepatitis C:   Outpatient follow up    Left shoulder pain:  X rays are non diagnostic  She is unable to lift the arm. But able to use the forearm.  No signs of cellulitis or swelling around the shoulder joint.  Get MRI of the shoulder without contrast for further evaluation.     DVT prophylaxis: scd's Code Status: full code.  Family Communication: none at bedside.  Disposition Plan: pending clinical improvement.    Consultants:   ID  Procedures: none.   Antimicrobials: ancef from 7/3  Subjective: Is teary when talking about her pain. Will increase the dose of oxy to 10 mg every 8 hours.    Objective: Vitals:   12/09/18 1900 12/09/18 2318 12/10/18 0130 12/10/18 0751  BP: 112/62 116/65 116/65 94/71  Pulse: (!) 55  70 72  Resp: 17 18    Temp: 98.6 F (37 C) 98.6 F (37 C)    TempSrc: Oral Oral    SpO2: 93% 94%  95%  Weight:      Height:        Intake/Output Summary (Last 24 hours) at 12/10/2018 1233 Last data filed at 12/10/2018 0500 Gross per 24 hour  Intake 1746.55 ml  Output -  Net 1746.55 ml   Filed Weights   12/08/18 2000 12/08/18 2030  Weight: 61.7 kg 61 kg    Examination:  General exam: in mild distress from pain.  Respiratory system: scattered wheezing posteriorly.  Cardiovascular system: S1 & S2 heard, RRR.  Gastrointestinal system:  Abdomen is nondistended, soft and nontender. No organomegaly or masses felt. Normal bowel sounds heard. Central nervous system: Alert and oriented. No focal neurological deficits. Extremities: no pedal edema. Unable to move the left arm from pain.  Skin: No rashes, lesions or ulcers Psychiatry:  Anxious.     Data Reviewed: I have personally reviewed following labs and imaging studies  CBC: Recent Labs  Lab 12/08/18 2223 12/09/18 0946  WBC 4.3 3.4*  NEUTROABS 3.5  --   HGB 11.1* 10.2*  HCT 32.8* 30.0*  MCV 89.1 88.0  PLT 53* 45*   Basic Metabolic Panel: Recent Labs  Lab 12/08/18 2223 12/09/18 0946  NA 130* 137  K  3.2* 4.3  CL 93* 102  CO2 26 24  GLUCOSE 122* 158*  BUN 8 10  CREATININE 0.78 0.50  CALCIUM 8.1* 8.1*  MG  --  2.3   GFR: Estimated Creatinine Clearance: 79.9 mL/min (by C-G formula based on SCr of 0.5 mg/dL). Liver Function Tests: Recent Labs  Lab 12/08/18 2223 12/09/18 0946  AST 21 30  ALT 18 22  ALKPHOS 46 48  BILITOT 1.0 0.5  PROT 7.0 6.2*  ALBUMIN 3.2* 2.7*   No results for input(s): LIPASE, AMYLASE in the last 168 hours. No results for input(s): AMMONIA in the last 168 hours. Coagulation Profile: No results for input(s): INR, PROTIME in the last 168 hours. Cardiac Enzymes: No results for input(s): CKTOTAL, CKMB, CKMBINDEX, TROPONINI in the last 168 hours. BNP (last 3 results) No results for input(s): PROBNP in the last 8760 hours. HbA1C: No results for input(s): HGBA1C in the last 72 hours. CBG: Recent Labs  Lab 12/08/18 2235  GLUCAP 110*   Lipid Profile: No results for input(s): CHOL, HDL, LDLCALC, TRIG, CHOLHDL, LDLDIRECT in the last 72 hours. Thyroid Function Tests: No results for input(s): TSH, T4TOTAL, FREET4, T3FREE, THYROIDAB in the last 72 hours. Anemia Panel: Recent Labs    12/09/18 1252 12/09/18 1309  VITAMINB12  --  423  FOLATE 12.2  --   FERRITIN  --  579*  TIBC  --  190*  IRON  --  16*  RETICCTPCT  --  1.0   Sepsis Labs: Recent Labs  Lab 12/09/18 1252  PROCALCITON 5.90  LATICACIDVEN 1.3    Recent Results (from the past 240 hour(s))  Culture, blood (routine x 2) Call MD if unable to obtain prior to antibiotics being given     Status: None (Preliminary result)   Collection Time: 12/08/18 10:20 PM   Specimen: BLOOD LEFT HAND  Result Value Ref Range Status   Specimen Description BLOOD LEFT HAND  Final   Special Requests   Final    BOTTLES DRAWN AEROBIC ONLY Blood Culture results may not be optimal due to an inadequate volume of blood received in culture bottles   Culture  Setup Time   Final    GRAM POSITIVE COCCI IN CLUSTERS  AEROBIC BOTTLE ONLY CRITICAL RESULT CALLED TO, READ BACK BY AND VERIFIED WITH: Hessie Diener. Dang PharmD 16:50 12/09/18 ([email or web address]) Performed at [facility], [address] [place], [place] [address]    Culture GRAM POSITIVE COCCI  Final   Report Status PENDING  Incomplete  Culture, blood (routine x 2) Call MD if unable to obtain prior to antibiotics being given     Status: None (Preliminary result)   Collection Time: 12/08/18 10:20 PM   Specimen: BLOOD RIGHT ARM  Result Value Ref Range Status   Specimen Description BLOOD RIGHT ARM  Final   Special Requests   Final    BOTTLES DRAWN AEROBIC ONLY Blood Culture results may not be optimal due to an inadequate volume of blood received in culture bottles   Culture   Final    NO GROWTH < 12 HOURS Performed at [facility], [address] [place], [place] [address]    Report Status PENDING  Incomplete  Blood Culture ID Panel (Reflexed)     Status: Abnormal   Collection Time: 12/08/18 10:20 PM  Result Value Ref Range Status   Enterococcus species NOT DETECTED NOT DETECTED Final   Listeria monocytogenes NOT DETECTED NOT DETECTED Final   Staphylococcus species DETECTED (A) NOT DETECTED Final    Comment: CRITICAL RESULT CALLED TO, READ BACK BY AND VERIFIED WITH: Hessie Diener. Dang PharmD 15:50 12/09/18 ([email or web address])    Staphylococcus aureus (BCID) DETECTED (A) NOT DETECTED Final    Comment: Methicillin (oxacillin) susceptible Staphylococcus aureus (MSSA). Preferred therapy is anti staphylococcal beta lactam antibiotic (Cefazolin or Nafcillin), unless clinically contraindicated. CRITICAL RESULT CALLED TO, READ BACK BY AND VERIFIED WITH: Hessie Diener. Dang PharmD 15:50 12/09/18 ([email or web address])    Methicillin resistance NOT DETECTED NOT DETECTED Final   Streptococcus species NOT DETECTED NOT DETECTED Final   Streptococcus agalactiae NOT DETECTED NOT DETECTED Final   Streptococcus pneumoniae NOT DETECTED NOT DETECTED Final   Streptococcus pyogenes NOT DETECTED NOT DETECTED Final    Acinetobacter baumannii NOT DETECTED NOT DETECTED Final   Enterobacteriaceae species NOT DETECTED NOT DETECTED Final   Enterobacter cloacae complex NOT DETECTED NOT DETECTED Final   Escherichia coli NOT DETECTED NOT DETECTED Final   Klebsiella oxytoca NOT DETECTED NOT DETECTED Final   Klebsiella pneumoniae NOT DETECTED NOT DETECTED Final   Proteus species NOT DETECTED NOT DETECTED Final   Serratia marcescens NOT DETECTED NOT DETECTED Final   Haemophilus influenzae NOT DETECTED NOT DETECTED Final   Neisseria meningitidis NOT DETECTED NOT DETECTED Final   Pseudomonas aeruginosa NOT DETECTED NOT DETECTED Final   Candida albicans NOT DETECTED NOT DETECTED Final   Candida glabrata NOT DETECTED NOT DETECTED Final   Candida krusei NOT DETECTED NOT DETECTED Final   Candida parapsilosis NOT DETECTED NOT DETECTED Final   Candida tropicalis NOT DETECTED NOT DETECTED Final    Comment: Performed at [facility], [address] [place], [place] [address]  SARS Coronavirus 2 (CEPHEID- Performed in [facility] hospital lab), Hosp Order     Status: None   Collection Time: 12/08/18 11:45 PM   Specimen: Nasopharyngeal Swab  Result Value Ref Range Status   SARS Coronavirus 2 NEGATIVE NEGATIVE Final    Comment: (NOTE) If result is NEGATIVE SARS-CoV-2 target nucleic acids are NOT DETECTED. The SARS-CoV-2 RNA is generally detectable in upper and lower  respiratory specimens during the acute phase of infection. The lowest  concentration of SARS-CoV-2 viral copies this assay can detect is 250  copies / mL. A negative result does not preclude SARS-CoV-2 infection  and should not be used as the sole basis for treatment or other  patient management decisions.  A negative result may occur with  improper specimen collection / handling, submission of specimen other  than nasopharyngeal swab, presence of viral mutation(s) within the  areas targeted by this assay, and inadequate number of viral copies   (<250 copies / mL). A negative result must be combined with clinical  observations, patient history, and epidemiological information. If result is POSITIVE SARS-CoV-2 target nucleic acids are DETECTED. The SARS-CoV-2 RNA is generally detectable  in upper and lower  respiratory specimens dur ing the acute phase of infection.  Positive  results are indicative of active infection with SARS-CoV-2.  Clinical  correlation with patient history and other diagnostic information is  necessary to determine patient infection status.  Positive results do  not rule out bacterial infection or co-infection with other viruses. If result is PRESUMPTIVE POSTIVE SARS-CoV-2 nucleic acids MAY BE PRESENT.   A presumptive positive result was obtained on the submitted specimen  and confirmed on repeat testing.  While 2019 novel coronavirus  (SARS-CoV-2) nucleic acids may be present in the submitted sample  additional confirmatory testing may be necessary for epidemiological  and / or clinical management purposes  to differentiate between  SARS-CoV-2 and other Sarbecovirus currently known to infect humans.  If clinically indicated additional testing with an alternate test  methodology [phone]([id number]) is advised. The SARS-CoV-2 RNA is generally  detectable in upper and lower respiratory sp ecimens during the acute  phase of infection. The expected result is Negative. Fact Sheet for Patients:  BoilerBrush.com.cyhttps://www.fda.gov/media/136312/download Fact Sheet for Healthcare Providers: https://pope.com/https://www.fda.gov/media/136313/download This test is not yet approved or cleared by the [place] FDA and has been authorized for detection and/or diagnosis of SARS-CoV-2 by FDA under an Emergency Use Authorization (EUA).  This EUA will remain in effect (meaning this test can be used) for the duration of the COVID-19 declaration under Section 564(b)(1) of the Act, 21 U.S.C. section 360bbb-3(b)(1), unless the authorization is terminated or  revoked sooner. Performed at [facility], [address] [place], [place] [address]   MRSA PCR Screening     Status: None   Collection Time: 12/09/18 12:10 AM   Specimen: Nasopharyngeal  Result Value Ref Range Status   MRSA by PCR NEGATIVE NEGATIVE Final    Comment:        The GeneXpert MRSA Assay (FDA approved for NASAL specimens only), is one component of a comprehensive MRSA colonization surveillance program. It is not intended to diagnose MRSA infection nor to guide or monitor treatment for MRSA infections. Performed at [facility], [address] [place], [place] [address]          Radiology Studies: Dg Chest Port 1 View  Result Date: 12/09/2018 CLINICAL DATA:  Acute on chronic respiratory failure. EXAM: PORTABLE CHEST 1 VIEW COMPARISON:  None. FINDINGS: The heart size and mediastinal contours are within normal limits. Normal pulmonary vascularity. Mildly coarsened interstitial markings are likely smoking-related. No focal consolidation, pleural effusion, or pneumothorax. No acute osseous abnormality. IMPRESSION: No active disease. Electronically Signed   By: Obie DredgeWilliam T Derry M.D.   On: 12/09/2018 12:19   Dg Shoulder Left  Result Date: 12/09/2018 CLINICAL DATA:  Left shoulder pain after multiple falls this week. EXAM: LEFT SHOULDER - 2+ VIEW COMPARISON:  None. FINDINGS: There is no evidence of fracture or dislocation. There is no evidence of arthropathy or other focal bone abnormality. Soft tissues are unremarkable. IMPRESSION: Negative. Electronically Signed   By: Lupita RaiderJames  Green Jr M.D.   On: 12/09/2018 13:43        Scheduled Meds: . folic acid  1 mg Oral Daily  . hydrocortisone sod succinate (SOLU-CORTEF) inj  100 mg Intravenous Q8H  . ipratropium-albuterol  3 mL Nebulization Q6H  . multivitamin with minerals  1 tablet Oral Daily  . sodium chloride flush  10-40 mL Intracatheter Q12H  . thiamine  100 mg Oral Daily   Continuous Infusions: . sodium  chloride 100 mL/hr at 12/10/18  1610  .  ceFAZolin (ANCEF) IV 2 g (12/10/18 0606)     LOS: 2 days    Time spent: 35 minutes.     Kathlen Mody, MD [facility] Pager [phone]   If 7PM-7AM, please contact night-coverage [email or web address] Password [facility] 12/10/2018, 12:33 PM

## 2018-12-10 NOTE — Progress Notes (Signed)
  Echocardiogram 2D Echocardiogram has been performed.  Merrie Roof F 12/10/2018, 11:19 AM

## 2018-12-10 NOTE — Evaluation (Signed)
Physical Therapy Evaluation Patient Details Name: [name] MRN: [id number] DOB: 12/15/1970 Today's Date: 12/10/2018   History of Present Illness  [name] is a 48 y.o. female was transferred [facility] from [facility] yesterday because of fever hypoxia and right upper lobe infiltrate seen on CT scan.  COVID testing there and after admission here was negative.  Admission blood cultures here are growing MSSA and 1 set.  She has a history of IVDU and hepatitis C.  Clinical Impression  Pt admitted with above diagnosis. Pt currently with functional limitations due to the deficits listed below (see PT Problem List). Pt was able to ambulate on unit with min guard assist with RW a good distance with good stability.  O2 on RA was 93% and > with activity.  Should progress well.   Pt will benefit from skilled PT to increase their independence and safety with mobility to allow discharge to the venue listed below.      Follow Up Recommendations No PT follow up    Equipment Recommendations  None recommended by PT    Recommendations for Other Services       Precautions / Restrictions Precautions Precautions: None Restrictions Weight Bearing Restrictions: No      Mobility  Bed Mobility Overal bed mobility: Independent                Transfers Overall transfer level: Independent                  Ambulation/Gait Ambulation/Gait assistance: Min guard Gait Distance (Feet): 560 Feet Assistive device: Rolling walker (2 wheeled) Gait Pattern/deviations: Step-through pattern;Decreased stride length   Gait velocity interpretation: <1.31 ft/sec, indicative of household ambulator General Gait Details: Pt was able to ambulate with RW on unit.  Pt without LOB and good stability with RW. Has a RW at home and states she will use it at all times.   Stairs            Wheelchair Mobility    Modified Rankin (Stroke Patients Only)       Balance Overall  balance assessment: Needs assistance Sitting-balance support: No upper extremity supported;Feet supported Sitting balance-Leahy Scale: Fair     Standing balance support: Bilateral upper extremity supported;During functional activity Standing balance-Leahy Scale: Fair Standing balance comment: Pt was able to stand statically without UE support.  Needs RW for dynamic balance.                              Pertinent Vitals/Pain Pain Assessment: No/denies pain    Home Living Family/patient expects to be discharged to:: Private residence Living Arrangements: Children;Spouse/significant other Available Help at Discharge: Family;Available 24 hours/day Type of Home: House Home Access: Stairs to enter Entrance Stairs-Rails: Right;Left;Can reach both Entrance Stairs-Number of Steps: 4 Home Layout: One level Home Equipment: Walker - 2 wheels      Prior Function Level of Independence: Independent               Hand Dominance        Extremity/Trunk Assessment   Upper Extremity Assessment Upper Extremity Assessment: Defer to OT evaluation    Lower Extremity Assessment Lower Extremity Assessment: Generalized weakness    Cervical / Trunk Assessment Cervical / Trunk Assessment: Normal  Communication   Communication: No difficulties  Cognition Arousal/Alertness: Awake/alert Behavior During Therapy: WFL for tasks assessed/performed Overall Cognitive Status: Within Functional Limits for tasks assessed  General Comments      Exercises     Assessment/Plan    PT Assessment Patient needs continued PT services  PT Problem List Decreased activity tolerance;Decreased balance;Decreased mobility;Decreased knowledge of use of DME;Decreased safety awareness;Decreased knowledge of precautions;Cardiopulmonary status limiting activity       PT Treatment Interventions DME instruction;Gait training;Functional mobility  training;Therapeutic activities;Therapeutic exercise;Balance training;Patient/family education    PT Goals (Current goals can be found in the Care Plan section)  Acute Rehab PT Goals Patient Stated Goal: to go home PT Goal Formulation: With patient Time For Goal Achievement: 12/24/18 Potential to Achieve Goals: Good    Frequency Min 3X/week   Barriers to discharge        Co-evaluation               AM-PAC PT "6 Clicks" Mobility  Outcome Measure Help needed turning from your back to your side while in a flat bed without using bedrails?: None Help needed moving from lying on your back to sitting on the side of a flat bed without using bedrails?: None Help needed moving to and from a bed to a chair (including a wheelchair)?: None Help needed standing up from a chair using your arms (e.g., wheelchair or bedside chair)?: A Little Help needed to walk in hospital room?: A Little Help needed climbing 3-5 steps with a railing? : A Little 6 Click Score: 21    End of Session Equipment Utilized During Treatment: Gait belt Activity Tolerance: Patient limited by fatigue Patient left: with call bell/phone within reach;in bed Nurse Communication: Mobility status PT Visit Diagnosis: Unsteadiness on feet (R26.81);Muscle weakness (generalized) (M62.81)    Time: 5329-9242 PT Time Calculation (min) (ACUTE ONLY): 16 min   Charges:   PT Evaluation $PT Eval Moderate Complexity: Rankin Pager:  [phone]  Office:  [place] 12/10/2018, 2:44 PM

## 2018-12-10 NOTE — Progress Notes (Addendum)
OT Cancellation Note  Patient Details Name: [name] MRN: [id number] DOB: 19-Sep-1970   Cancelled Treatment:    Reason Eval/Treat Not Completed: Patient declined, no reason specified (Pt finishing PT earlier and requesting to wait on medications before further therapy. Agreeable to therapy later. Will return as schedule allows. Thank you.)  Heywood Footman Avonell Lenig 12/10/2018, 3:40 PM

## 2018-12-10 NOTE — Progress Notes (Signed)
Patient getting Echo at this time. Will attempt to give breathing treatment when procedure is complete.

## 2018-12-10 NOTE — Progress Notes (Signed)
Pt complaining of severe anxiety and feeling like she can't breathe. Pt sitting in the bed clutching knees and respirations in the 20s, HR in the 70s, and O2 saturation 95%. Patient asking for PRN xanax. RN told pt xanax was ordered 2 times daily and she couldn't have another dose at this time. Pt asked if RN could page night time coverage about Xanax dosing and frequency. RN paged Bodenheimer, NP and Xanax order was changed to 1mg  3x daily PRN. RN administered 1mg  of Xanax. Will continue to monitor and assess pt.

## 2018-12-11 ENCOUNTER — Inpatient Hospital Stay (HOSPITAL_COMMUNITY): Payer: Medicaid Other

## 2018-12-11 LAB — CBC
HCT: 32.2 % — ABNORMAL LOW (ref 36.0–46.0)
Hemoglobin: 10.4 g/dL — ABNORMAL LOW (ref 12.0–15.0)
MCH: 29.6 pg (ref 26.0–34.0)
MCHC: 32.3 g/dL (ref 30.0–36.0)
MCV: 91.7 fL (ref 80.0–100.0)
Platelets: 85 10*3/uL — ABNORMAL LOW (ref 150–400)
RBC: 3.51 MIL/uL — ABNORMAL LOW (ref 3.87–5.11)
RDW: 13.9 % (ref 11.5–15.5)
WBC: 4.7 10*3/uL (ref 4.0–10.5)
nRBC: 0 % (ref 0.0–0.2)

## 2018-12-11 LAB — BASIC METABOLIC PANEL
Anion gap: 10 (ref 5–15)
BUN: 9 mg/dL (ref 6–20)
CO2: 25 mmol/L (ref 22–32)
Calcium: 7.8 mg/dL — ABNORMAL LOW (ref 8.9–10.3)
Chloride: 102 mmol/L (ref 98–111)
Creatinine, Ser: 0.53 mg/dL (ref 0.44–1.00)
GFR calc Af Amer: >60 mL/min (ref >60)
GFR calc non Af Amer: >60 mL/min (ref >60)
Glucose, Bld: 130 mg/dL — ABNORMAL HIGH (ref 70–99)
Potassium: 4.2 mmol/L (ref 3.5–5.1)
Sodium: 137 mmol/L (ref 135–145)

## 2018-12-11 LAB — CULTURE, BLOOD (ROUTINE X 2)

## 2018-12-11 MED ORDER — SENNOSIDES-DOCUSATE SODIUM 8.6-50 MG PO TABS
2.0000 | ORAL_TABLET | Freq: Two times a day (BID) | ORAL | Status: DC
  Administered 2018-12-11 – 2018-12-18 (×7): 2 via ORAL
  Filled 2018-12-11 (×11): qty 2

## 2018-12-11 MED ORDER — FERROUS SULFATE 325 (65 FE) MG PO TABS
325.0000 mg | ORAL_TABLET | Freq: Two times a day (BID) | ORAL | Status: DC
  Administered 2018-12-11 – 2018-12-18 (×13): 325 mg via ORAL
  Filled 2018-12-11 (×13): qty 1

## 2018-12-11 MED ORDER — HYDROCORTISONE NA SUCCINATE PF 100 MG IJ SOLR: 50.0000 mg | Freq: Three times a day (TID) | INTRAMUSCULAR | Status: DC

## 2018-12-11 MED ORDER — ALPRAZOLAM 0.5 MG PO TABS
1.0000 mg | ORAL_TABLET | Freq: Four times a day (QID) | ORAL | Status: DC | PRN
  Administered 2018-12-11 – 2018-12-14 (×13): 1 mg via ORAL
  Filled 2018-12-11 (×12): qty 2

## 2018-12-11 NOTE — Evaluation (Signed)
Occupational Therapy Evaluation Patient Details Name: [name] MRN: [id number] DOB: 1970-10-22 Today's Date: 12/11/2018    History of Present Illness [name] is a 48 y.o. female was transferred up from [facility] yesterday because of fever hypoxia and right upper lobe infiltrate seen on CT scan.  COVID testing there and after admission here was negative.  Admission blood cultures here are growing MSSA and 1 set.  She has a history of IVDU and hepatitis C.   Clinical Impression    Pt PTA: independent and caring for 46yo daughter. Pt currently limited by weakness in BUES, poor grip strength and decreased ability to perform ADL safely.  Pt reports pain intermittently with movement, but then picks up BSC to move it in room with BUEs with no difficulty. Pt seen ambulating in room from bathroom upon arrival with no pain to report. Pt minguardA with RW for transfers and most mobility.  LUE with limtied ROM at shoulder to 45* flex; elbow flex/ext with AAROM, WFLs and hand grip/pinch decreased with any fine motor activity. Continued LUE therapy/assessing as pt was leaving for MRI. Pt using IV pole for stability in room, but RW in hallway ~100 feet. Pt would greatly benefit from continued OT for LUE HEP and adapting ADL. OT following.    Follow Up Recommendations  Outpatient OT    Equipment Recommendations  None recommended by OT    Recommendations for Other Services       Precautions / Restrictions Precautions Precautions: None Restrictions Weight Bearing Restrictions: No      Mobility Bed Mobility Overal bed mobility: Independent                Transfers Overall transfer level: Needs assistance Equipment used: Rolling walker (2 wheeled) Transfers: Sit to/from Stand Sit to Stand: Min guard              Balance Overall balance assessment: Needs assistance Sitting-balance support: No upper extremity supported;Feet supported Sitting  balance-Leahy Scale: Fair     Standing balance support: Bilateral upper extremity supported;During functional activity Standing balance-Leahy Scale: Fair Standing balance comment: Pt was able to stand dynamically with 1 UE support.  Needs RW for dynamic balance.                            ADL either performed or assessed with clinical judgement   ADL Overall ADL's : Needs assistance/impaired Eating/Feeding: Modified independent;Sitting   Grooming: Set up;Sitting   Upper Body Bathing: Set up;Sitting   Lower Body Bathing: Min guard;Sitting/lateral leans;Sit to/from stand   Upper Body Dressing : Set up;Sitting   Lower Body Dressing: Min guard;Sitting/lateral leans;Sit to/from stand   Toilet Transfer: Min guard   Toileting- Water quality scientist and Hygiene: Min guard;Sitting/lateral lean;Sit to/from stand       Functional mobility during ADLs: Min guard;Rolling walker;Cueing for safety General ADL Comments: Pt using BUEs to lift BSC to move 3' and able to brush teeth with RUE and LUE holding toothpaste cap. Pt inconsistently using LUE, but reports longstanding pain     Vision Baseline Vision/History: No visual deficits Vision Assessment?: No apparent visual deficits     Perception     Praxis      Pertinent Vitals/Pain Pain Assessment: No/denies pain     Hand Dominance     Extremity/Trunk Assessment Upper Extremity Assessment Upper Extremity Assessment: Generalized weakness;LUE deficits/detail LUE Deficits / Details: inconsistent LU weakness 2+/5 MM grade LUE Coordination:  decreased fine motor;decreased gross motor   Lower Extremity Assessment Lower Extremity Assessment: Defer to PT evaluation   Cervical / Trunk Assessment Cervical / Trunk Assessment: Normal   Communication Communication Communication: No difficulties   Cognition Arousal/Alertness: Awake/alert Behavior During Therapy: WFL for tasks assessed/performed Overall Cognitive Status:  Within Functional Limits for tasks assessed                                     General Comments  Pt with weakness in BUEs. Pt reports pain at times with movement, but then picks up BSC to move it in room with BUEs and no difficulty. Pt with increased pain with movement, but pt seen ambulating in room from bathroom upon arrival with no pain to report.     Exercises     Shoulder Instructions      Home Living Family/patient expects to be discharged to:: Private residence Living Arrangements: Children(pt reports that sig other does not live with her) Available Help at Discharge: Family;Available PRN/intermittently(daughter is 17 yo) Type of Home: House Home Access: Stairs to enter Entergy CorporationEntrance Stairs-Number of Steps: 4 Entrance Stairs-Rails: Right;Left;Can reach both Home Layout: One level     Bathroom Shower/Tub: Chief Strategy OfficerTub/shower unit   Bathroom Toilet: Standard     Home Equipment: Environmental consultantWalker - 2 wheels          Prior Functioning/Environment Level of Independence: Independent                 OT Problem List: Decreased strength;Decreased range of motion;Decreased activity tolerance;Impaired balance (sitting and/or standing);Decreased coordination;Decreased safety awareness;Pain;Impaired UE functional use      OT Treatment/Interventions: Self-care/ADL training;Therapeutic exercise;Neuromuscular education;Energy conservation;DME and/or AE instruction;Therapeutic activities;Patient/family education;Balance training    OT Goals(Current goals can be found in the care plan section) Acute Rehab OT Goals Patient Stated Goal: to go home OT Goal Formulation: With patient Time For Goal Achievement: 12/25/18 Potential to Achieve Goals: Good ADL Goals Pt Will Perform Toileting - Clothing Manipulation and hygiene: with modified independence;sit to/from stand Pt/caregiver will Perform Home Exercise Program: Increased strength;Left upper extremity;With written HEP  provided;Independently Additional ADL Goal #1: Pt will improve to independent with OOB ADL in standing with fair balance.  OT Frequency: Min 2X/week   Barriers to D/C: Decreased caregiver support          Co-evaluation              AM-PAC OT "6 Clicks" Daily Activity     Outcome Measure Help from another person eating meals?: None Help from another person taking care of personal grooming?: None Help from another person toileting, which includes using toliet, bedpan, or urinal?: None Help from another person bathing (including washing, rinsing, drying)?: A Little Help from another person to put on and taking off regular upper body clothing?: A Little Help from another person to put on and taking off regular lower body clothing?: A Little 6 Click Score: 21   End of Session Equipment Utilized During Treatment: Gait belt;Rolling walker Nurse Communication: Mobility status  Activity Tolerance: Patient limited by pain Patient left: in chair;Other (comment)(pt in transport chair ready for MRI)  OT Visit Diagnosis: Unsteadiness on feet (R26.81);Muscle weakness (generalized) (M62.81);Pain Pain - Right/Left: Left Pain - part of body: Shoulder                Time: 1200-1220 OT Time Calculation (min): 20 min Charges:  OT General Charges $  OT Visit: 1 Visit OT Evaluation $OT Eval Moderate Complexity: 1 Mod  Cristi LoronAllison (Jelenek) Glendell [place] OTR/L Acute Rehabilitation Services Pager: [phone] Office: [phone]   Lonzo CloudLLISON J Felica Chargois 12/11/2018, 2:52 PM

## 2018-12-11 NOTE — Plan of Care (Signed)
  Problem: Education: Goal: Knowledge of General Education information will improve Description: Including pain rating scale, medication(s)/side effects and non-pharmacologic comfort measures Outcome: Adequate for Discharge   Problem: Clinical Measurements: Goal: Respiratory complications will improve Outcome: Adequate for Discharge   Problem: Pain Managment: Goal: General experience of comfort will improve Outcome: Adequate for Discharge

## 2018-12-11 NOTE — Progress Notes (Signed)
PROGRESS NOTE    [name]  [id number] DOB: Nov 02, 1970 DOA: 12/08/2018 PCP: No primary care provider on file.    Brief Narrative:   [name] is a [age] y.o. female with medical history significant of IV drug abuse, emphysema who presented [facility] with acute respiratory failure and hypoxemia.   Assessment & Plan:   Principal Problem:   Bacteremia due to methicillin susceptible Staphylococcus aureus (MSSA) Active Problems:   Acute on chronic respiratory failure with hypoxia (HCC)   Pneumonia   Hyponatremia   Hypokalemia   COPD (chronic obstructive pulmonary disease) (HCC)   Left shoulder pain   Normocytic anemia   Thrombocytopenia (HCC)   IVDU (intravenous drug user)   History of hepatitis C   Acute respiratory failure with hypoxia Secondary to community acquired pneumonia/ right upper lobe pneumonia.  CT angio negative for PE. Repeat CXR does not show any pneumonia.  Continue with IV antibiotics/ ancef and bronchodilators as needed.  She was weaned off oxygen yesterday evening.  She reports some wheezing today but her oxygen sats are good.  Prn bronchodilators.    MSSA bacteremia:  On ancef, TTE does not show any endocarditis.  Repeat cultures are pending and negative so far. Appreciate ID recommendations.   TEE  requested which probably will be done Monday or Tuesday.   Hypokalemia: replaced.   Hypomagnesemia:  Replaced.    Anemia of chronic disease:  Anemia panel ordered. Shows low iron levels, will add iron supplementation.    Thrombocytopenia:  Probably from hepatitis C.  No signs of bleeding.     Hyponatremia:  Resolved with hydration.    Heavy alcohol abuse:  With some withdrawal symptoms.  CIWA on board.    Cocaine abuse UDS ordered, positive for benzodiazepines and opiates. Will get CSW for resources.    Chronic pain syndrome:  Pt reports she is on oxycodone, which I do not see on her AVS.  Will  request pharmacy to confirm the dose of the oxycodone.    H/o hepatitis C:  Outpatient follow up    Left shoulder pain:  X rays are non diagnostic  She is unable to lift the arm. But able to use the forearm.  No signs of cellulitis or swelling around the shoulder joint.  Get MRI of the shoulder without contrast for further evaluation.     DVT prophylaxis: scd's Code Status: full code.  Family Communication: none at bedside.  Disposition Plan: pending clinical improvement.    Consultants:   ID  Procedures: TEE  Antimicrobials: ancef from 7/3  Subjective:    Objective: Vitals:   12/11/18 0358 12/11/18 0600 12/11/18 0836 12/11/18 1140  BP: 116/65 128/80    Pulse: (!) 42 60 (!) 57 (!) 56  Resp:    16  Temp: 97.6 F (36.4 C)  97.9 F (36.6 C) 97.6 F (36.4 C)  TempSrc: Oral  Oral Oral  SpO2: 95%     Weight:      Height:       No intake or output data in the 24 hours ending 12/11/18 1306 Filed Weights   12/08/18 2000 12/08/18 2030  Weight: 61.7 kg 61 kg    Examination:  General exam: calm and comfortable.  Respiratory system: very faint wheezing, air entry fair, Cardiovascular system: S1 & S2 heard, RRR. No JVD or pedal edema.  Gastrointestinal system: Abdomen is nondistended, soft and nontender. No organomegaly or masses felt. Normal bowel sounds heard. Central nervous system: Alert  and oriented. No focal neurological deficits. Extremities: no pedal edema. Unable to move the left arm from pain.  Skin: No rashes, lesions or ulcers Psychiatry:  Anxious.     Data Reviewed: I have personally reviewed following labs and imaging studies  CBC: Recent Labs  Lab 12/08/18 2223 12/09/18 0946 12/11/18 0718  WBC 4.3 3.4* 4.7  NEUTROABS 3.5  --   --   HGB 11.1* 10.2* 10.4*  HCT 32.8* 30.0* 32.2*  MCV 89.1 88.0 91.7  PLT 53* 45* 85*   Basic Metabolic Panel: Recent Labs  Lab 12/08/18 2223 12/09/18 0946 12/11/18 0718  NA 130* 137 137  K 3.2* 4.3 4.2   CL 93* 102 102  CO2 26 24 25   GLUCOSE 122* 158* 130*  BUN 8 10 9   CREATININE 0.78 0.50 0.53  CALCIUM 8.1* 8.1* 7.8*  MG  --  2.3  --    GFR: Estimated Creatinine Clearance: 79.9 mL/min (by C-G formula based on SCr of 0.53 mg/dL). Liver Function Tests: Recent Labs  Lab 12/08/18 2223 12/09/18 0946  AST 21 30  ALT 18 22  ALKPHOS 46 48  BILITOT 1.0 0.5  PROT 7.0 6.2*  ALBUMIN 3.2* 2.7*   No results for input(s): LIPASE, AMYLASE in the last 168 hours. No results for input(s): AMMONIA in the last 168 hours. Coagulation Profile: No results for input(s): INR, PROTIME in the last 168 hours. Cardiac Enzymes: No results for input(s): CKTOTAL, CKMB, CKMBINDEX, TROPONINI in the last 168 hours. BNP (last 3 results) No results for input(s): PROBNP in the last 8760 hours. HbA1C: No results for input(s): HGBA1C in the last 72 hours. CBG: Recent Labs  Lab 12/08/18 2235  GLUCAP 110*   Lipid Profile: No results for input(s): CHOL, HDL, LDLCALC, TRIG, CHOLHDL, LDLDIRECT in the last 72 hours. Thyroid Function Tests: No results for input(s): TSH, T4TOTAL, FREET4, T3FREE, THYROIDAB in the last 72 hours. Anemia Panel: Recent Labs    12/09/18 1252 12/09/18 1309  VITAMINB12  --  423  FOLATE 12.2  --   FERRITIN  --  579*  TIBC  --  190*  IRON  --  16*  RETICCTPCT  --  1.0   Sepsis Labs: Recent Labs  Lab 12/09/18 1252  PROCALCITON 5.90  LATICACIDVEN 1.3    Recent Results (from the past 240 hour(s))  Culture, blood (routine x 2) Call MD if unable to obtain prior to antibiotics being given     Status: Abnormal   Collection Time: 12/08/18 10:20 PM   Specimen: BLOOD LEFT HAND  Result Value Ref Range Status   Specimen Description BLOOD LEFT HAND  Final   Special Requests   Final    BOTTLES DRAWN AEROBIC ONLY Blood Culture results may not be optimal due to an inadequate volume of blood received in culture bottles   Culture  Setup Time   Final    GRAM POSITIVE COCCI IN CLUSTERS  AEROBIC BOTTLE ONLY CRITICAL RESULT CALLED TO, READ BACK BY AND VERIFIED WITH: Hessie Diener. Dang PharmD 16:50 12/09/18 ([email or web address]) Performed at [facility], [address] [place], [place] [address]    Culture STAPHYLOCOCCUS AUREUS (A)  Final   Report Status 12/11/2018 FINAL  Final   Organism ID, Bacteria STAPHYLOCOCCUS AUREUS  Final      Susceptibility   Staphylococcus aureus - MIC*    CIPROFLOXACIN <=0.5 SENSITIVE Sensitive     ERYTHROMYCIN >=8 RESISTANT Resistant     GENTAMICIN <=0.5 SENSITIVE Sensitive     OXACILLIN <=  0.25 SENSITIVE Sensitive     TETRACYCLINE <=1 SENSITIVE Sensitive     VANCOMYCIN 1 SENSITIVE Sensitive     TRIMETH/SULFA <=10 SENSITIVE Sensitive     CLINDAMYCIN <=0.25 SENSITIVE Sensitive     RIFAMPIN <=0.5 SENSITIVE Sensitive     Inducible Clindamycin NEGATIVE Sensitive     * STAPHYLOCOCCUS AUREUS  Culture, blood (routine x 2) Call MD if unable to obtain prior to antibiotics being given     Status: None (Preliminary result)   Collection Time: 12/08/18 10:20 PM   Specimen: BLOOD RIGHT ARM  Result Value Ref Range Status   Specimen Description BLOOD RIGHT ARM  Final   Special Requests   Final    BOTTLES DRAWN AEROBIC ONLY Blood Culture results may not be optimal due to an inadequate volume of blood received in culture bottles   Culture   Final    NO GROWTH 2 DAYS Performed at [facility], [address] [place], [place] [address]    Report Status PENDING  Incomplete  Blood Culture ID Panel (Reflexed)     Status: Abnormal   Collection Time: 12/08/18 10:20 PM  Result Value Ref Range Status   Enterococcus species NOT DETECTED NOT DETECTED Final   Listeria monocytogenes NOT DETECTED NOT DETECTED Final   Staphylococcus species DETECTED (A) NOT DETECTED Final    Comment: CRITICAL RESULT CALLED TO, READ BACK BY AND VERIFIED WITH: Hessie Diener. Dang PharmD 15:50 12/09/18 (wilsonm)    Staphylococcus aureus (BCID) DETECTED (A) NOT DETECTED Final    Comment: Methicillin  (oxacillin) susceptible Staphylococcus aureus (MSSA). Preferred therapy is anti staphylococcal beta lactam antibiotic (Cefazolin or Nafcillin), unless clinically contraindicated. CRITICAL RESULT CALLED TO, READ BACK BY AND VERIFIED WITH: Hessie Diener. Dang PharmD 15:50 12/09/18 ([email or web address])    Methicillin resistance NOT DETECTED NOT DETECTED Final   Streptococcus species NOT DETECTED NOT DETECTED Final   Streptococcus agalactiae NOT DETECTED NOT DETECTED Final   Streptococcus pneumoniae NOT DETECTED NOT DETECTED Final   Streptococcus pyogenes NOT DETECTED NOT DETECTED Final   Acinetobacter baumannii NOT DETECTED NOT DETECTED Final   Enterobacteriaceae species NOT DETECTED NOT DETECTED Final   Enterobacter cloacae complex NOT DETECTED NOT DETECTED Final   Escherichia coli NOT DETECTED NOT DETECTED Final   Klebsiella oxytoca NOT DETECTED NOT DETECTED Final   Klebsiella pneumoniae NOT DETECTED NOT DETECTED Final   Proteus species NOT DETECTED NOT DETECTED Final   Serratia marcescens NOT DETECTED NOT DETECTED Final   Haemophilus influenzae NOT DETECTED NOT DETECTED Final   Neisseria meningitidis NOT DETECTED NOT DETECTED Final   Pseudomonas aeruginosa NOT DETECTED NOT DETECTED Final   Candida albicans NOT DETECTED NOT DETECTED Final   Candida glabrata NOT DETECTED NOT DETECTED Final   Candida krusei NOT DETECTED NOT DETECTED Final   Candida parapsilosis NOT DETECTED NOT DETECTED Final   Candida tropicalis NOT DETECTED NOT DETECTED Final    Comment: Performed at [facility], [address] [place], [place] [address]  SARS Coronavirus 2 (CEPHEID- Performed in [facility] hospital lab), Hosp Order     Status: None   Collection Time: 12/08/18 11:45 PM   Specimen: Nasopharyngeal Swab  Result Value Ref Range Status   SARS Coronavirus 2 NEGATIVE NEGATIVE Final    Comment: (NOTE) If result is NEGATIVE SARS-CoV-2 target nucleic acids are NOT DETECTED. The SARS-CoV-2 RNA is generally detectable in upper  and lower  respiratory specimens during the acute phase of infection. The lowest  concentration of SARS-CoV-2 viral copies this assay  can detect is 250  copies / mL. A negative result does not preclude SARS-CoV-2 infection  and should not be used as the sole basis for treatment or other  patient management decisions.  A negative result may occur with  improper specimen collection / handling, submission of specimen other  than nasopharyngeal swab, presence of viral mutation(s) within the  areas targeted by this assay, and inadequate number of viral copies  (<250 copies / mL). A negative result must be combined with clinical  observations, patient history, and epidemiological information. If result is POSITIVE SARS-CoV-2 target nucleic acids are DETECTED. The SARS-CoV-2 RNA is generally detectable in upper and lower  respiratory specimens dur ing the acute phase of infection.  Positive  results are indicative of active infection with SARS-CoV-2.  Clinical  correlation with patient history and other diagnostic information is  necessary to determine patient infection status.  Positive results do  not rule out bacterial infection or co-infection with other viruses. If result is PRESUMPTIVE POSTIVE SARS-CoV-2 nucleic acids MAY BE PRESENT.   A presumptive positive result was obtained on the submitted specimen  and confirmed on repeat testing.  While 2019 novel coronavirus  (SARS-CoV-2) nucleic acids may be present in the submitted sample  additional confirmatory testing may be necessary for epidemiological  and / or clinical management purposes  to differentiate between  SARS-CoV-2 and other Sarbecovirus currently known to infect humans.  If clinically indicated additional testing with an alternate test  methodology [phone]) is advised. The SARS-CoV-2 RNA is generally  detectable in upper and lower respiratory sp ecimens during the acute  phase of infection. The expected result is  Negative. Fact Sheet for Patients:  BoilerBrush.com.cy Fact Sheet for Healthcare Providers: https://pope.com/ This test is not yet approved or cleared by the [place] FDA and has been authorized for detection and/or diagnosis of SARS-CoV-2 by FDA under an Emergency Use Authorization (EUA).  This EUA will remain in effect (meaning this test can be used) for the duration of the COVID-19 declaration under Section 564(b)(1) of the Act, 21 U.S.C. section 360bbb-3(b)(1), unless the authorization is terminated or revoked sooner. Performed at [facility], [address], [place], [place] [address]   MRSA PCR Screening     Status: None   Collection Time: 12/09/18 12:10 AM   Specimen: Nasopharyngeal  Result Value Ref Range Status   MRSA by PCR NEGATIVE NEGATIVE Final    Comment:        The GeneXpert MRSA Assay (FDA approved for NASAL specimens only), is one component of a comprehensive MRSA colonization surveillance program. It is not intended to diagnose MRSA infection nor to guide or monitor treatment for MRSA infections. Performed at [facility], [address] [place], [place] [address]   Culture, blood (routine x 2)     Status: None (Preliminary result)   Collection Time: 12/09/18  9:47 PM   Specimen: BLOOD  Result Value Ref Range Status   Specimen Description BLOOD LEFT HAND  Final   Special Requests   Final    BOTTLES DRAWN AEROBIC ONLY Blood Culture results may not be optimal due to an inadequate volume of blood received in culture bottles   Culture   Final    NO GROWTH < 24 HOURS Performed at [facility], [address] [place], [place] [address]    Report Status PENDING  Incomplete  Culture, blood (routine x 2)     Status: None (Preliminary result)  Collection Time: 12/09/18  9:55 PM   Specimen: BLOOD  Result Value Ref Range Status   Specimen Description BLOOD RIGHT HAND  Final   Special  Requests   Final    BOTTLES DRAWN AEROBIC AND ANAEROBIC Blood Culture results may not be optimal due to an inadequate volume of blood received in culture bottles   Culture   Final    NO GROWTH < 24 HOURS Performed at [facility], [address] [place], [place] [address]    Report Status PENDING  Incomplete         Radiology Studies: No results found.      Scheduled Meds: . folic acid  1 mg Oral Daily  . ipratropium-albuterol  3 mL Nebulization Q6H  . multivitamin with minerals  1 tablet Oral Daily  . sodium chloride flush  10-40 mL Intracatheter Q12H  . thiamine  100 mg Oral Daily   Continuous Infusions: . sodium chloride 100 mL/hr at 12/11/18 0414  .  ceFAZolin (ANCEF) IV 2 g (12/11/18 0409)     LOS: 3 days    Time spent: 35 minutes.     Hosie Poisson, MD [facility] Pager [phone]   If 7PM-7AM, please contact night-coverage [email or web address] Password [email or web address] 12/11/2018, 1:06 PM

## 2018-12-11 NOTE — Progress Notes (Signed)
Pt complaining that her xanax is only 3 times a day and should be 4 times a day also complaining per Percocet is only 5mg  and should be 15mg . When this RN asked where the pain was pt stated "I broke my back is why I am on pain medication". Physician is making rounds.

## 2018-12-12 DIAGNOSIS — F419 Anxiety disorder, unspecified: Secondary | ICD-10-CM

## 2018-12-12 MED ORDER — GABAPENTIN 400 MG PO CAPS
400.0000 mg | ORAL_CAPSULE | Freq: Four times a day (QID) | ORAL | Status: DC
  Administered 2018-12-12: 400 mg via ORAL
  Filled 2018-12-12: qty 1

## 2018-12-12 MED ORDER — GABAPENTIN 400 MG PO CAPS
400.0000 mg | ORAL_CAPSULE | Freq: Once | ORAL | Status: AC
  Administered 2018-12-12: 400 mg via ORAL
  Filled 2018-12-12: qty 1

## 2018-12-12 MED ORDER — OXYCODONE HCL 5 MG PO TABS
15.0000 mg | ORAL_TABLET | Freq: Four times a day (QID) | ORAL | Status: DC | PRN
  Administered 2018-12-12 – 2018-12-18 (×23): 15 mg via ORAL
  Filled 2018-12-12 (×23): qty 3

## 2018-12-12 MED ORDER — SODIUM CHLORIDE 0.9 % IV SOLN
INTRAVENOUS | Status: DC
  Administered 2018-12-13: 08:00:00 via INTRAVENOUS

## 2018-12-12 MED ORDER — GABAPENTIN 400 MG PO CAPS
800.0000 mg | ORAL_CAPSULE | Freq: Four times a day (QID) | ORAL | Status: DC
  Administered 2018-12-12 – 2018-12-18 (×23): 800 mg via ORAL
  Filled 2018-12-12 (×9): qty 2
  Filled 2018-12-12: qty 8
  Filled 2018-12-12 (×14): qty 2

## 2018-12-12 MED ORDER — IPRATROPIUM-ALBUTEROL 0.5-2.5 (3) MG/3ML IN SOLN
3.0000 mL | Freq: Two times a day (BID) | RESPIRATORY_TRACT | Status: DC
  Administered 2018-12-12 – 2018-12-14 (×3): 3 mL via RESPIRATORY_TRACT
  Filled 2018-12-12 (×4): qty 3

## 2018-12-12 MED ORDER — GABAPENTIN 800 MG PO TABS
800.0000 mg | ORAL_TABLET | Freq: Four times a day (QID) | ORAL | Status: DC
  Filled 2018-12-12 (×3): qty 1

## 2018-12-12 MED ORDER — METHOCARBAMOL 500 MG PO TABS
500.0000 mg | ORAL_TABLET | Freq: Four times a day (QID) | ORAL | Status: DC | PRN
  Administered 2018-12-12 – 2018-12-14 (×7): 500 mg via ORAL
  Filled 2018-12-12 (×7): qty 1

## 2018-12-12 MED ORDER — IPRATROPIUM-ALBUTEROL 0.5-2.5 (3) MG/3ML IN SOLN: 3.0000 mL | Freq: Two times a day (BID) | RESPIRATORY_TRACT | Status: DC

## 2018-12-12 NOTE — Anesthesia Preprocedure Evaluation (Addendum)
Anesthesia Evaluation  Patient identified by MRN, date of birth, ID band Patient awake    Reviewed: Allergy & Precautions, H&P , NPO status , Patient's Chart, lab work & pertinent test results, reviewed documented beta blocker date and time   Airway Mallampati: II  TM Distance: >3 FB Neck ROM: Full    Dental no notable dental hx. (+) Edentulous Upper, Dental Advisory Given   Pulmonary COPD, Current Smoker,    Pulmonary exam normal breath sounds clear to auscultation       Cardiovascular Exercise Tolerance: Good negative cardio ROS   Rhythm:Regular Rate:Normal     Neuro/Psych negative neurological ROS  negative psych ROS   GI/Hepatic negative GI ROS, Neg liver ROS,   Endo/Other  negative endocrine ROS  Renal/GU negative Renal ROS  negative genitourinary   Musculoskeletal   Abdominal   Peds  Hematology  (+) Blood dyscrasia, anemia ,   Anesthesia Other Findings   Reproductive/Obstetrics negative OB ROS                            Anesthesia Physical Anesthesia Plan  ASA: III  Anesthesia Plan: MAC   Post-op Pain Management:    Induction: Intravenous  PONV Risk Score and Plan: 1 and Propofol infusion  Airway Management Planned: Nasal Cannula  Additional Equipment:   Intra-op Plan:   Post-operative Plan:   Informed Consent: I have reviewed the patients History and Physical, chart, labs and discussed the procedure including the risks, benefits and alternatives for the proposed anesthesia with the patient or authorized representative who has indicated his/her understanding and acceptance.     Dental advisory given  Plan Discussed with: CRNA  Anesthesia Plan Comments:         Anesthesia Quick Evaluation

## 2018-12-12 NOTE — Progress Notes (Signed)
Patient ID: [name], female   DOB: Sep 02, 1970, 48 y.o.   MRN: [id number]         [facility]  Date of Admission:  12/08/2018           Day 4 cefazolin ASSESSMENT: She has pneumonia complicated by MSSA bacteremia.  Both [facility] done [facility] are positive and 1 of 2 admission blood cultures here is positive.  Repeat blood cultures are negative.  There is no evidence of endocarditis by TTE.  TEE has been ordered.  MRI of her left shoulder did not reveal any evidence of infection.   I was able to locate the results of hepatitis C antibodies and care everywhere.  She was antibody positive in March 2017 but there was no detectable virus indicative of spontaneous clearing.  She does not need treatment.  PLAN: 1. Continue cefazolin 2. Proceed with PICC placement 3. Await results of TEE  Principal Problem:   Bacteremia due to methicillin susceptible Staphylococcus aureus (MSSA) Active Problems:   Pneumonia   Left shoulder pain   Acute on chronic respiratory failure with hypoxia (HCC)   Hyponatremia   Hypokalemia   COPD (chronic obstructive pulmonary disease) (HCC)   Normocytic anemia   Thrombocytopenia (HCC)   IVDU (intravenous drug user)   History of hepatitis C   Scheduled Meds: . ferrous sulfate  325 mg Oral BID WC  . folic acid  1 mg Oral Daily  . ipratropium-albuterol  3 mL Nebulization Q6H  . multivitamin with minerals  1 tablet Oral Daily  . senna-docusate  2 tablet Oral BID  . sodium chloride flush  10-40 mL Intracatheter Q12H  . thiamine  100 mg Oral Daily   Continuous Infusions: . sodium chloride 75 mL/hr at 12/12/18 0921  .  ceFAZolin (ANCEF) IV 2 g (12/12/18 0458)   PRN Meds:.ALPRAZolam, guaiFENesin, LORazepam **OR** LORazepam, oxyCODONE-acetaminophen, sodium chloride flush, traMADol   SUBJECTIVE: She is feeling a little bit better.  She has not had shortness of breath and her wheezing has  improved.  She is still having pain in her left shoulder.  Review of Systems: Review of Systems  Constitutional: Negative for chills, diaphoresis and fever.  Respiratory: Positive for cough, shortness of breath and wheezing. Negative for sputum production.   Cardiovascular: Negative for chest pain.  Gastrointestinal: Negative for abdominal pain, diarrhea, nausea and vomiting.  Genitourinary: Negative for dysuria.  Musculoskeletal: Positive for joint pain.  Neurological: Negative for headaches.  Psychiatric/Behavioral: Negative for depression. The patient is nervous/anxious.     Allergies  Allergen Reactions  . Morphine And Related Hives  . Zofran [Ondansetron Hcl] Other (See Comments)    Pt said it made her really sick    OBJECTIVE: Vitals:   12/12/18 0328 12/12/18 0458 12/12/18 0741 12/12/18 0756  BP: 130/69   126/70  Pulse: 71 70 68 72  Resp:   20 18  Temp: 98 F (36.7 C)   97.8 F (36.6 C)  TempSrc: Oral   Oral  SpO2: 96%  96% 97%  Weight:      Height:       Body mass index is 22.38 kg/m.  Physical Exam Constitutional:      Comments: She is sitting up in bed.  She is very talkative and slightly anxious.  She requested increase medication for anxiety and pain.  HENT:     Mouth/Throat:     Comments: Many broken or missing teeth. Eyes:  Conjunctiva/sclera: Conjunctivae normal.  Cardiovascular:     Rate and Rhythm: Normal rate and regular rhythm.     Heart sounds: No murmur.     Comments: Distant heart sounds. Pulmonary:     Effort: Pulmonary effort is normal.     Breath sounds: Wheezing and rales present.  Abdominal:     Palpations: Abdomen is soft.     Tenderness: There is no abdominal tenderness.  Musculoskeletal:        General: Tenderness present. No swelling.     Comments: There is tenderness with palpation of her anterior left shoulder.  There is no unusual warmth, redness or swelling.  Skin:    Findings: No rash.     Lab Results Lab Results   Component Value Date   WBC 4.7 12/11/2018   HGB 10.4 (L) 12/11/2018   HCT 32.2 (L) 12/11/2018   MCV 91.7 12/11/2018   PLT 85 (L) 12/11/2018    Lab Results  Component Value Date   CREATININE 0.53 12/11/2018   BUN 9 12/11/2018   NA 137 12/11/2018   K 4.2 12/11/2018   CL 102 12/11/2018   CO2 25 12/11/2018    Lab Results  Component Value Date   ALT 22 12/09/2018   AST 30 12/09/2018   ALKPHOS 48 12/09/2018   BILITOT 0.5 12/09/2018     Microbiology: Recent Results (from the past 240 hour(s))  Culture, blood (routine x 2) Call MD if unable to obtain prior to antibiotics being given     Status: Abnormal   Collection Time: 12/08/18 10:20 PM   Specimen: BLOOD LEFT HAND  Result Value Ref Range Status   Specimen Description BLOOD LEFT HAND  Final   Special Requests   Final    BOTTLES DRAWN AEROBIC ONLY Blood Culture results may not be optimal due to an inadequate volume of blood received in culture bottles   Culture  Setup Time   Final    GRAM POSITIVE COCCI IN CLUSTERS AEROBIC BOTTLE ONLY CRITICAL RESULT CALLED TO, READ BACK BY AND VERIFIED WITH: Diona Browner PharmD 16:50 12/09/18 ([email or web address]) Performed at [facility], [address] [place], [place] [address]    Culture STAPHYLOCOCCUS AUREUS (A)  Final   Report Status 12/11/2018 FINAL  Final   Organism ID, Bacteria STAPHYLOCOCCUS AUREUS  Final      Susceptibility   Staphylococcus aureus - MIC*    CIPROFLOXACIN <=0.5 SENSITIVE Sensitive     ERYTHROMYCIN >=8 RESISTANT Resistant     GENTAMICIN <=0.5 SENSITIVE Sensitive     OXACILLIN <=0.25 SENSITIVE Sensitive     TETRACYCLINE <=1 SENSITIVE Sensitive     VANCOMYCIN 1 SENSITIVE Sensitive     TRIMETH/SULFA <=10 SENSITIVE Sensitive     CLINDAMYCIN <=0.25 SENSITIVE Sensitive     RIFAMPIN <=0.5 SENSITIVE Sensitive     Inducible Clindamycin NEGATIVE Sensitive     * STAPHYLOCOCCUS AUREUS  Culture, blood (routine x 2) Call MD if unable to obtain prior to antibiotics being given      Status: None (Preliminary result)   Collection Time: 12/08/18 10:20 PM   Specimen: BLOOD RIGHT ARM  Result Value Ref Range Status   Specimen Description BLOOD RIGHT ARM  Final   Special Requests   Final    BOTTLES DRAWN AEROBIC ONLY Blood Culture results may not be optimal due to an inadequate volume of blood received in culture bottles   Culture   Final    NO GROWTH 3 DAYS Performed at [facility]  Lab, [address] [place], [place] [address]    Report Status PENDING  Incomplete  Blood Culture ID Panel (Reflexed)     Status: Abnormal   Collection Time: 12/08/18 10:20 PM  Result Value Ref Range Status   Enterococcus species NOT DETECTED NOT DETECTED Final   Listeria monocytogenes NOT DETECTED NOT DETECTED Final   Staphylococcus species DETECTED (A) NOT DETECTED Final    Comment: CRITICAL RESULT CALLED TO, READ BACK BY AND VERIFIED WITH: Hessie Diener. Dang PharmD 15:50 12/09/18 ([email or web address])    Staphylococcus aureus (BCID) DETECTED (A) NOT DETECTED Final    Comment: Methicillin (oxacillin) susceptible Staphylococcus aureus (MSSA). Preferred therapy is anti staphylococcal beta lactam antibiotic (Cefazolin or Nafcillin), unless clinically contraindicated. CRITICAL RESULT CALLED TO, READ BACK BY AND VERIFIED WITH: Hessie Diener. Dang PharmD 15:50 12/09/18 ([email or web address])    Methicillin resistance NOT DETECTED NOT DETECTED Final   Streptococcus species NOT DETECTED NOT DETECTED Final   Streptococcus agalactiae NOT DETECTED NOT DETECTED Final   Streptococcus pneumoniae NOT DETECTED NOT DETECTED Final   Streptococcus pyogenes NOT DETECTED NOT DETECTED Final   Acinetobacter baumannii NOT DETECTED NOT DETECTED Final   Enterobacteriaceae species NOT DETECTED NOT DETECTED Final   Enterobacter cloacae complex NOT DETECTED NOT DETECTED Final   Escherichia coli NOT DETECTED NOT DETECTED Final   Klebsiella oxytoca NOT DETECTED NOT DETECTED Final   Klebsiella pneumoniae NOT DETECTED NOT DETECTED Final   Proteus species NOT  DETECTED NOT DETECTED Final   Serratia marcescens NOT DETECTED NOT DETECTED Final   Haemophilus influenzae NOT DETECTED NOT DETECTED Final   Neisseria meningitidis NOT DETECTED NOT DETECTED Final   Pseudomonas aeruginosa NOT DETECTED NOT DETECTED Final   Candida albicans NOT DETECTED NOT DETECTED Final   Candida glabrata NOT DETECTED NOT DETECTED Final   Candida krusei NOT DETECTED NOT DETECTED Final   Candida parapsilosis NOT DETECTED NOT DETECTED Final   Candida tropicalis NOT DETECTED NOT DETECTED Final    Comment: Performed at [facility], [address] [place], [place] [address]  SARS Coronavirus 2 (CEPHEID- Performed in [facility] hospital lab), Hosp Order     Status: None   Collection Time: 12/08/18 11:45 PM   Specimen: Nasopharyngeal Swab  Result Value Ref Range Status   SARS Coronavirus 2 NEGATIVE NEGATIVE Final    Comment: (NOTE) If result is NEGATIVE SARS-CoV-2 target nucleic acids are NOT DETECTED. The SARS-CoV-2 RNA is generally detectable in upper and lower  respiratory specimens during the acute phase of infection. The lowest  concentration of SARS-CoV-2 viral copies this assay can detect is 250  copies / mL. A negative result does not preclude SARS-CoV-2 infection  and should not be used as the sole basis for treatment or other  patient management decisions.  A negative result may occur with  improper specimen collection / handling, submission of specimen other  than nasopharyngeal swab, presence of viral mutation(s) within the  areas targeted by this assay, and inadequate number of viral copies  (<250 copies / mL). A negative result must be combined with clinical  observations, patient history, and epidemiological information. If result is POSITIVE SARS-CoV-2 target nucleic acids are DETECTED. The SARS-CoV-2 RNA is generally detectable in upper and lower  respiratory specimens dur ing the acute phase of infection.  Positive  results are indicative of  active infection with SARS-CoV-2.  Clinical  correlation with patient history and other diagnostic information is  necessary to determine patient infection status.  Positive results do  not rule out  bacterial infection or co-infection with other viruses. If result is PRESUMPTIVE POSTIVE SARS-CoV-2 nucleic acids MAY BE PRESENT.   A presumptive positive result was obtained on the submitted specimen  and confirmed on repeat testing.  While 2019 novel coronavirus  (SARS-CoV-2) nucleic acids may be present in the submitted sample  additional confirmatory testing may be necessary for epidemiological  and / or clinical management purposes  to differentiate between  SARS-CoV-2 and other Sarbecovirus currently known to infect humans.  If clinically indicated additional testing with an alternate test  methodology [phone]([id number]) is advised. The SARS-CoV-2 RNA is generally  detectable in upper and lower respiratory sp ecimens during the acute  phase of infection. The expected result is Negative. Fact Sheet for Patients:  BoilerBrush.com.cyhttps://www.fda.gov/media/136312/download Fact Sheet for Healthcare Providers: https://pope.com/https://www.fda.gov/media/136313/download This test is not yet approved or cleared by the [place] FDA and has been authorized for detection and/or diagnosis of SARS-CoV-2 by FDA under an Emergency Use Authorization (EUA).  This EUA will remain in effect (meaning this test can be used) for the duration of the COVID-19 declaration under Section 564(b)(1) of the Act, 21 U.S.C. section 360bbb-3(b)(1), unless the authorization is terminated or revoked sooner. Performed at [facility], [address] [place], [place] [address]   MRSA PCR Screening     Status: None   Collection Time: 12/09/18 12:10 AM   Specimen: Nasopharyngeal  Result Value Ref Range Status   MRSA by PCR NEGATIVE NEGATIVE Final    Comment:        The GeneXpert MRSA Assay (FDA approved for NASAL specimens only), is one  component of a comprehensive MRSA colonization surveillance program. It is not intended to diagnose MRSA infection nor to guide or monitor treatment for MRSA infections. Performed at [facility], [address] [place], [place] [address]   Culture, blood (routine x 2)     Status: None (Preliminary result)   Collection Time: 12/09/18  9:47 PM   Specimen: BLOOD  Result Value Ref Range Status   Specimen Description BLOOD LEFT HAND  Final   Special Requests   Final    BOTTLES DRAWN AEROBIC ONLY Blood Culture results may not be optimal due to an inadequate volume of blood received in culture bottles   Culture   Final    NO GROWTH 2 DAYS Performed at [facility], [address] [place], [place] [address]    Report Status PENDING  Incomplete  Culture, blood (routine x 2)     Status: None (Preliminary result)   Collection Time: 12/09/18  9:55 PM   Specimen: BLOOD  Result Value Ref Range Status   Specimen Description BLOOD RIGHT HAND  Final   Special Requests   Final    BOTTLES DRAWN AEROBIC AND ANAEROBIC Blood Culture results may not be optimal due to an inadequate volume of blood received in culture bottles   Culture   Final    NO GROWTH 2 DAYS Performed at [facility], [address] [place], [place] [address]    Report Status PENDING  Incomplete    Cliffton AstersJohn Traquan Duarte, MD [facility] [phone] pager   [phone] cell 12/12/2018, 9:39 AM

## 2018-12-12 NOTE — Progress Notes (Signed)
Physical Therapy Treatment Patient Details Name: Maria BirminghamMisty Noel Chandler MRN: [id number] DOB: 1971-06-07 Today's Date: 12/12/2018    History of Present Illness [name] is a 48 y.o. female [facility] because of fever hypoxia and right upper lobe infiltrate seen on CT scan.  COVID testing there and after admission here was negative.  Admission blood cultures here are growing MSSA and 1 set.  She has a history of IVDU and hepatitis C.    PT Comments    Pt doing well with mobility. Ready for dc home from PT standpoint whenever medically ready.   Follow Up Recommendations  No PT follow up     Equipment Recommendations  None recommended by PT    Recommendations for Other Services       Precautions / Restrictions Precautions Precautions: None Restrictions Weight Bearing Restrictions: No    Mobility  Bed Mobility Overal bed mobility: Independent                Transfers Overall transfer level: Independent Equipment used: None Transfers: Sit to/from Stand Sit to Stand: Independent            Ambulation/Gait Ambulation/Gait assistance: Modified independent (Device/Increase time) Gait Distance (Feet): 350 Feet Assistive device: Rolling walker (2 wheeled);None Gait Pattern/deviations: Step-through pattern   Gait velocity interpretation: >2.62 ft/sec, indicative of community ambulatory General Gait Details: Pt with steady gait with and without walker   Stairs             Wheelchair Mobility    Modified Rankin (Stroke Patients Only)       Balance Overall balance assessment: No apparent balance deficits (not formally assessed)   Sitting balance-Leahy Scale: Normal     Standing balance support: No upper extremity supported Standing balance-Leahy Scale: Normal Standing balance comment: Pt able to stand on one foot to untangle herself from lines/tubes. No signs of instability                            Cognition Arousal/Alertness: Awake/alert Behavior During Therapy: WFL for tasks assessed/performed Overall Cognitive Status: Within Functional Limits for tasks assessed                                        Exercises      General Comments        Pertinent Vitals/Pain Pain Assessment: 0-10 Pain Score: 10-Worst pain ever Pain Intervention(s): Premedicated before session    Home Living                      Prior Function            PT Goals (current goals can now be found in the care plan section) Acute Rehab PT Goals Patient Stated Goal: to go home Progress towards PT goals: Progressing toward goals    Frequency    Min 3X/week      PT Plan Current plan remains appropriate    Co-evaluation              AM-PAC PT "6 Clicks" Mobility   Outcome Measure  Help needed turning from your back to your side while in a flat bed without using bedrails?: None Help needed moving from lying on your back to sitting on the side of a flat bed without using bedrails?: None Help  needed moving to and from a bed to a chair (including a wheelchair)?: None Help needed standing up from a chair using your arms (e.g., wheelchair or bedside chair)?: None Help needed to walk in Chandler room?: None Help needed climbing 3-5 steps with a railing? : A Little 6 Click Score: 23    End of Session   Activity Tolerance: Patient tolerated treatment well Patient left: in bed;with call bell/phone within reach   PT Visit Diagnosis: Other abnormalities of gait and mobility (R26.89)     Time: 0539-7673 PT Time Calculation (min) (ACUTE ONLY): 17 min  Charges:  $Gait Training: 8-22 mins                     [place] Pager [phone] Office [place] 12/12/2018, 2:08 PM

## 2018-12-12 NOTE — Progress Notes (Signed)
Pharmacy Antibiotic Note  [name] is a 48 y.o. female admitted on 12/08/2018 with SOB.  Patient has a history of IVDU.  Blood culture growing MSSA in 1 of 2 bottles thus far.  Pharmacy has been consulted to narrow antibiotics to Ancef. TTE negative for endocarditis. TEE has been requested.   SCr 0.53, CrCL 80 ml/min, currently afebrile (Tmax 103 on admit), WBC 4.7.  Plan: Ancef 2gm IV Q8H Monitor renal fxn, repeat blood cultures, f/u TEE  Height: 5\' 5"  (165.1 cm) Weight: 134 lb 7.7 oz (61 kg) IBW/kg (Calculated) : 57  Temp (24hrs), Avg:97.8 F (36.6 C), Min:97.5 F (36.4 C), Max:98 F (36.7 C)  Recent Labs  Lab 12/08/18 2223 12/09/18 0946 12/09/18 1252 12/11/18 0718  WBC 4.3 3.4*  --  4.7  CREATININE 0.78 0.50  --  0.53  LATICACIDVEN  --   --  1.3  --     Estimated Creatinine Clearance: 79.9 mL/min (by C-G formula based on SCr of 0.53 mg/dL).    Allergies  Allergen Reactions  . Morphine And Related Hives  . Zofran [Ondansetron Hcl] Other (See Comments)    Pt said it made her really sick    Azith 7/2 >> 7/3 CTX 7/2 >> 7/3 Ancef 7/3 >>  7/2 covid - negative 7/2 MRSA PCR - negative 7/2 BCx - GPC 1 of 2 (BCID MSSA)  [name] A. Levada Dy, PharmD, [place] Please utilize Amion for appropriate phone number to reach the unit pharmacist ([place])   12/12/2018, 9:18 AM

## 2018-12-12 NOTE — Progress Notes (Signed)
Patient c/o chest pain. EKG performed and showed sinus tachycardia. Verification of where the pain is located is confirmed and patient verifies that it is her right breast hurting 10/10, feeling sharp. Dr. Karleen [place] paged and made aware.

## 2018-12-12 NOTE — Progress Notes (Signed)
PROGRESS NOTE    [name]  ZOX:[id number] DOB: 1971-06-04 DOA: 12/08/2018 PCP: No primary care provider on file.    Brief Narrative:   [name] is a 48 y.o. female with medical history significant of IV drug abuse, emphysema who presented to [facility] with acute respiratory failure and hypoxemia.   Assessment & Plan:   Principal Problem:   Bacteremia due to methicillin susceptible Staphylococcus aureus (MSSA) Active Problems:   Acute on chronic respiratory failure with hypoxia (HCC)   Pneumonia   Hyponatremia   Hypokalemia   COPD (chronic obstructive pulmonary disease) (HCC)   Left shoulder pain   Normocytic anemia   Thrombocytopenia (HCC)   IVDU (intravenous drug user)   History of hepatitis C   Acute respiratory failure with hypoxia Secondary to community acquired pneumonia/ right upper lobe pneumonia.  CT angio negative for PE. Repeat CXR does not show any pneumonia.  Continue with IV antibiotics/ ancef and bronchodilators as needed.  She was weaned off oxygen yesterday evening. No wheezing heard today.  Prn bronchodilators.    MSSA bacteremia:  On ancef, TTE does not show any endocarditis.  Repeat cultures are pending and negative so far. Appreciate ID recommendations.   TEE  requested , to be done today.   Hypokalemia: replaced.   Hypomagnesemia:  Replaced.    Anemia of chronic disease:  Anemia panel ordered. Shows low iron levels, will add iron supplementation.    Thrombocytopenia:  Probably from hepatitis C.  No signs of bleeding.     Hyponatremia:  Resolved with hydration.    Heavy alcohol abuse:  With some withdrawal symptoms.  CIWA on board.    Cocaine abuse UDS ordered, positive for benzodiazepines and opiates. Will get CSW for resources.    Chronic pain syndrome:  Pt reports she is on oxycodone, which I do not see on her AVS.  Will request pharmacy to confirm the dose of the oxycodone.    H/o  hepatitis C:  Outpatient follow up    Left shoulder pain:  X rays are non diagnostic  She is unable to lift the arm. But able to use the forearm.  No signs of cellulitis or swelling around the shoulder joint.  Get MRI of the shoulder without contrast for further evaluation,it showed intact rotator cuff, but bursitis.      DVT prophylaxis: scd's Code Status: full code.  Family Communication: none at bedside.  Disposition Plan: pending clinical improvement.    Consultants:   ID  Procedures: TEE  Antimicrobials: ancef from 7/3  Subjective: Repots 10 mg of oxy is not helping her.  Restarted her on home meds.     Objective: Vitals:   12/12/18 0328 12/12/18 0458 12/12/18 0741 12/12/18 0756  BP: 130/69   126/70  Pulse: 71 70 68 72  Resp:   20 18  Temp: 98 F (36.7 C)   97.8 F (36.6 C)  TempSrc: Oral   Oral  SpO2: 96%  96% 97%  Weight:      Height:        Intake/Output Summary (Last 24 hours) at 12/12/2018 1316 Last data filed at 12/12/2018 0600 Gross per 24 hour  Intake 1045 ml  Output -  Net 1045 ml   Filed Weights   12/08/18 2000 12/08/18 2030  Weight: 61.7 kg 61 kg    Examination:  General exam: not in distress Respiratory system: no wheezin gheard Cardiovascular system: S1 & S2 heard, RRR. No JVD or pedal edema.  Gastrointestinal system: Abdomen is soft NT ND BS+ Central nervous system: Alert and oriented. No focal neurological deficits. Extremities: no pedal edema.  Able to use her left arm without any issues.  Skin: No rashes, lesions or ulcers Psychiatry:  Mood Is okay.     Data Reviewed: I have personally reviewed following labs and imaging studies  CBC: Recent Labs  Lab 12/08/18 2223 12/09/18 0946 12/11/18 0718  WBC 4.3 3.4* 4.7  NEUTROABS 3.5  --   --   HGB 11.1* 10.2* 10.4*  HCT 32.8* 30.0* 32.2*  MCV 89.1 88.0 91.7  PLT 53* 45* 85*   Basic Metabolic Panel: Recent Labs  Lab 12/08/18 2223 12/09/18 0946 12/11/18 0718  NA 130*  137 137  K 3.2* 4.3 4.2  CL 93* 102 102  CO2 26 24 25   GLUCOSE 122* 158* 130*  BUN 8 10 9   CREATININE 0.78 0.50 0.53  CALCIUM 8.1* 8.1* 7.8*  MG  --  2.3  --    GFR: Estimated Creatinine Clearance: 79.9 mL/min (by C-G formula based on SCr of 0.53 mg/dL). Liver Function Tests: Recent Labs  Lab 12/08/18 2223 12/09/18 0946  AST 21 30  ALT 18 22  ALKPHOS 46 48  BILITOT 1.0 0.5  PROT 7.0 6.2*  ALBUMIN 3.2* 2.7*   No results for input(s): LIPASE, AMYLASE in the last 168 hours. No results for input(s): AMMONIA in the last 168 hours. Coagulation Profile: No results for input(s): INR, PROTIME in the last 168 hours. Cardiac Enzymes: No results for input(s): CKTOTAL, CKMB, CKMBINDEX, TROPONINI in the last 168 hours. BNP (last 3 results) No results for input(s): PROBNP in the last 8760 hours. HbA1C: No results for input(s): HGBA1C in the last 72 hours. CBG: Recent Labs  Lab 12/08/18 2235  GLUCAP 110*   Lipid Profile: No results for input(s): CHOL, HDL, LDLCALC, TRIG, CHOLHDL, LDLDIRECT in the last 72 hours. Thyroid Function Tests: No results for input(s): TSH, T4TOTAL, FREET4, T3FREE, THYROIDAB in the last 72 hours. Anemia Panel: No results for input(s): VITAMINB12, FOLATE, FERRITIN, TIBC, IRON, RETICCTPCT in the last 72 hours. Sepsis Labs: Recent Labs  Lab 12/09/18 1252  PROCALCITON 5.90  LATICACIDVEN 1.3    Recent Results (from the past 240 hour(s))  Culture, blood (routine x 2) Call MD if unable to obtain prior to antibiotics being given     Status: Abnormal   Collection Time: 12/08/18 10:20 PM   Specimen: BLOOD LEFT HAND  Result Value Ref Range Status   Specimen Description BLOOD LEFT HAND  Final   Special Requests   Final    BOTTLES DRAWN AEROBIC ONLY Blood Culture results may not be optimal due to an inadequate volume of blood received in culture bottles   Culture  Setup Time   Final    GRAM POSITIVE COCCI IN CLUSTERS AEROBIC BOTTLE ONLY CRITICAL RESULT  CALLED TO, READ BACK BY AND VERIFIED WITH: Diona Browner PharmD 16:50 12/09/18 ([email or web address]) Performed at [facility], [address], [place], [place] [address]    Culture STAPHYLOCOCCUS AUREUS (A)  Final   Report Status 12/11/2018 FINAL  Final   Organism ID, Bacteria STAPHYLOCOCCUS AUREUS  Final      Susceptibility   Staphylococcus aureus - MIC*    CIPROFLOXACIN <=0.5 SENSITIVE Sensitive     ERYTHROMYCIN >=8 RESISTANT Resistant     GENTAMICIN <=0.5 SENSITIVE Sensitive     OXACILLIN <=0.25 SENSITIVE Sensitive     TETRACYCLINE <=1 SENSITIVE Sensitive     VANCOMYCIN  1 SENSITIVE Sensitive     TRIMETH/SULFA <=10 SENSITIVE Sensitive     CLINDAMYCIN <=0.25 SENSITIVE Sensitive     RIFAMPIN <=0.5 SENSITIVE Sensitive     Inducible Clindamycin NEGATIVE Sensitive     * STAPHYLOCOCCUS AUREUS  Culture, blood (routine x 2) Call MD if unable to obtain prior to antibiotics being given     Status: None (Preliminary result)   Collection Time: 12/08/18 10:20 PM   Specimen: BLOOD RIGHT ARM  Result Value Ref Range Status   Specimen Description BLOOD RIGHT ARM  Final   Special Requests   Final    BOTTLES DRAWN AEROBIC ONLY Blood Culture results may not be optimal due to an inadequate volume of blood received in culture bottles   Culture   Final    NO GROWTH 3 DAYS Performed at [facility], [address] [place], [place] [address]    Report Status PENDING  Incomplete  Blood Culture ID Panel (Reflexed)     Status: Abnormal   Collection Time: 12/08/18 10:20 PM  Result Value Ref Range Status   Enterococcus species NOT DETECTED NOT DETECTED Final   Listeria monocytogenes NOT DETECTED NOT DETECTED Final   Staphylococcus species DETECTED (A) NOT DETECTED Final    Comment: CRITICAL RESULT CALLED TO, READ BACK BY AND VERIFIED WITH: Hessie Diener. Dang PharmD 15:50 12/09/18 ([email or web address])    Staphylococcus aureus (BCID) DETECTED (A) NOT DETECTED Final    Comment: Methicillin (oxacillin) susceptible Staphylococcus aureus  (MSSA). Preferred therapy is anti staphylococcal beta lactam antibiotic (Cefazolin or Nafcillin), unless clinically contraindicated. CRITICAL RESULT CALLED TO, READ BACK BY AND VERIFIED WITH: Hessie Diener. Dang PharmD 15:50 12/09/18 ([email or web address])    Methicillin resistance NOT DETECTED NOT DETECTED Final   Streptococcus species NOT DETECTED NOT DETECTED Final   Streptococcus agalactiae NOT DETECTED NOT DETECTED Final   Streptococcus pneumoniae NOT DETECTED NOT DETECTED Final   Streptococcus pyogenes NOT DETECTED NOT DETECTED Final   Acinetobacter baumannii NOT DETECTED NOT DETECTED Final   Enterobacteriaceae species NOT DETECTED NOT DETECTED Final   Enterobacter cloacae complex NOT DETECTED NOT DETECTED Final   Escherichia coli NOT DETECTED NOT DETECTED Final   Klebsiella oxytoca NOT DETECTED NOT DETECTED Final   Klebsiella pneumoniae NOT DETECTED NOT DETECTED Final   Proteus species NOT DETECTED NOT DETECTED Final   Serratia marcescens NOT DETECTED NOT DETECTED Final   Haemophilus influenzae NOT DETECTED NOT DETECTED Final   Neisseria meningitidis NOT DETECTED NOT DETECTED Final   Pseudomonas aeruginosa NOT DETECTED NOT DETECTED Final   Candida albicans NOT DETECTED NOT DETECTED Final   Candida glabrata NOT DETECTED NOT DETECTED Final   Candida krusei NOT DETECTED NOT DETECTED Final   Candida parapsilosis NOT DETECTED NOT DETECTED Final   Candida tropicalis NOT DETECTED NOT DETECTED Final    Comment: Performed at [facility], [address] [place], [place] [address]  SARS Coronavirus 2 (CEPHEID- Performed in [facility] lab), Hosp Order     Status: None   Collection Time: 12/08/18 11:45 PM   Specimen: Nasopharyngeal Swab  Result Value Ref Range Status   SARS Coronavirus 2 NEGATIVE NEGATIVE Final    Comment: (NOTE) If result is NEGATIVE SARS-CoV-2 target nucleic acids are NOT DETECTED. The SARS-CoV-2 RNA is generally detectable in upper and lower  respiratory specimens during the  acute phase of infection. The lowest  concentration of SARS-CoV-2 viral copies this assay can detect is 250  copies / mL. A negative result does not preclude SARS-CoV-2 infection  and should not be used as the sole basis for treatment or other  patient management decisions.  A negative result may occur with  improper specimen collection / handling, submission of specimen other  than nasopharyngeal swab, presence of viral mutation(s) within the  areas targeted by this assay, and inadequate number of viral copies  (<250 copies / mL). A negative result must be combined with clinical  observations, patient history, and epidemiological information. If result is POSITIVE SARS-CoV-2 target nucleic acids are DETECTED. The SARS-CoV-2 RNA is generally detectable in upper and lower  respiratory specimens dur ing the acute phase of infection.  Positive  results are indicative of active infection with SARS-CoV-2.  Clinical  correlation with patient history and other diagnostic information is  necessary to determine patient infection status.  Positive results do  not rule out bacterial infection or co-infection with other viruses. If result is PRESUMPTIVE POSTIVE SARS-CoV-2 nucleic acids MAY BE PRESENT.   A presumptive positive result was obtained on the submitted specimen  and confirmed on repeat testing.  While 2019 novel coronavirus  (SARS-CoV-2) nucleic acids may be present in the submitted sample  additional confirmatory testing may be necessary for epidemiological  and / or clinical management purposes  to differentiate between  SARS-CoV-2 and other Sarbecovirus currently known to infect humans.  If clinically indicated additional testing with an alternate test  methodology [phone]) is advised. The SARS-CoV-2 RNA is generally  detectable in upper and lower respiratory sp ecimens during the acute  phase of infection. The expected result is Negative. Fact Sheet for Patients:   BoilerBrush.com.cy Fact Sheet for Healthcare Providers: https://pope.com/ This test is not yet approved or cleared by the [place] FDA and has been authorized for detection and/or diagnosis of SARS-CoV-2 by FDA under an Emergency Use Authorization (EUA).  This EUA will remain in effect (meaning this test can be used) for the duration of the COVID-19 declaration under Section 564(b)(1) of the Act, 21 U.S.C. section 360bbb-3(b)(1), unless the authorization is terminated or revoked sooner. Performed at [facility], [address], [place], [place] [address]   MRSA PCR Screening     Status: None   Collection Time: 12/09/18 12:10 AM   Specimen: Nasopharyngeal  Result Value Ref Range Status   MRSA by PCR NEGATIVE NEGATIVE Final    Comment:        The GeneXpert MRSA Assay (FDA approved for NASAL specimens only), is one component of a comprehensive MRSA colonization surveillance program. It is not intended to diagnose MRSA infection nor to guide or monitor treatment for MRSA infections. Performed at [facility], [address], [place], [place] [address]   Culture, blood (routine x 2)     Status: None (Preliminary result)   Collection Time: 12/09/18  9:47 PM   Specimen: BLOOD  Result Value Ref Range Status   Specimen Description BLOOD LEFT HAND  Final   Special Requests   Final    BOTTLES DRAWN AEROBIC ONLY Blood Culture results may not be optimal due to an inadequate volume of blood received in culture bottles   Culture   Final    NO GROWTH 2 DAYS Performed at [facility], [address] [place], [place] [address]    Report Status PENDING  Incomplete  Culture, blood (routine x 2)     Status: None (Preliminary result)   Collection Time: 12/09/18  9:55 PM   Specimen: BLOOD  Result Value Ref Range Status  Specimen Description BLOOD RIGHT HAND  Final   Special Requests   Final    BOTTLES DRAWN AEROBIC AND  ANAEROBIC Blood Culture results may not be optimal due to an inadequate volume of blood received in culture bottles   Culture   Final    NO GROWTH 2 DAYS Performed at [facility], [address] [place], [place] [address]    Report Status PENDING  Incomplete         Radiology Studies: Mr Shoulder Left Wo Contrast  Result Date: 12/11/2018 CLINICAL DATA:  Multiple falls.  Left shoulder pain. EXAM: MRI OF THE LEFT SHOULDER WITHOUT CONTRAST TECHNIQUE: Multiplanar, multisequence MR imaging of the shoulder was performed. No intravenous contrast was administered. COMPARISON:  Radiographs 12/09/2018 FINDINGS: Rotator cuff: Normal rotator cuff tendons. No significant tendinopathy and no partial or full-thickness tear. Muscles:  Normal Biceps long head:  Intact Acromioclavicular Joint: No degenerative changes. Type 2-3 acromion. No lateral downsloping or undersurface spurring. Glenohumeral Joint: Normal articular cartilage.  No joint effusion. Labrum:  No definite labral tears. Bones:  No acute bony findings. Other: Moderate subacromial/subdeltoid bursitis. IMPRESSION: 1. Intact rotator cuff tendons. 2. No acute bony findings. 3. Intact long head biceps tendon and glenoid labrum. 4. Moderate subacromial/subdeltoid bursitis. Electronically Signed   By: Rudie MeyerP.  Gallerani M.D.   On: 12/11/2018 13:25        Scheduled Meds: . ferrous sulfate  325 mg Oral BID WC  . folic acid  1 mg Oral Daily  . gabapentin  800 mg Oral QID  . ipratropium-albuterol  3 mL Nebulization Q6H  . multivitamin with minerals  1 tablet Oral Daily  . senna-docusate  2 tablet Oral BID  . sodium chloride flush  10-40 mL Intracatheter Q12H  . thiamine  100 mg Oral Daily   Continuous Infusions: .  ceFAZolin (ANCEF) IV 2 g (12/12/18 1229)     LOS: 4 days    Time spent: 35 minutes.     Kathlen ModyVijaya Garrette Caine, MD [facility] Pager [phone]   If 7PM-7AM, please contact night-coverage [email or web address] Password [email or web address]  12/12/2018, 1:16 PM

## 2018-12-12 NOTE — Progress Notes (Signed)
    [facility] has been requested to perform a transesophageal echocardiogram on [facility] for bacteremia.  After careful review of history and examination, the risks and benefits of transesophageal echocardiogram have been explained including risks of esophageal damage, perforation (1:10,000 risk), bleeding, pharyngeal hematoma as well as other potential complications associated with conscious sedation including aspiration, arrhythmia, respiratory failure and death. Alternatives to treatment were discussed, questions were answered. Patient is willing to proceed.   Pt is scheduled for TEE tomorrow at [place], NPO at [facility] tonight.  Tami Lin Kinzee Happel, [place]  12/12/2018 2:28 PM

## 2018-12-13 ENCOUNTER — Inpatient Hospital Stay (HOSPITAL_COMMUNITY): Payer: Medicaid Other | Admitting: Anesthesiology

## 2018-12-13 ENCOUNTER — Encounter (HOSPITAL_COMMUNITY): Payer: Self-pay | Admitting: Emergency Medicine

## 2018-12-13 ENCOUNTER — Inpatient Hospital Stay (HOSPITAL_COMMUNITY): Payer: Medicaid Other

## 2018-12-13 ENCOUNTER — Encounter (HOSPITAL_COMMUNITY)
Admission: AD | Disposition: A | Discharge status: Home Health | Payer: Self-pay | Source: Other Acute Inpatient Hospital | Attending: Internal Medicine

## 2018-12-13 DIAGNOSIS — R0789 Other chest pain: Secondary | ICD-10-CM

## 2018-12-13 LAB — BASIC METABOLIC PANEL
Anion gap: 10 (ref 5–15)
BUN: <5 mg/dL — ABNORMAL LOW (ref 6–20)
CO2: 28 mmol/L (ref 22–32)
Calcium: 8.4 mg/dL — ABNORMAL LOW (ref 8.9–10.3)
Chloride: 100 mmol/L (ref 98–111)
Creatinine, Ser: 0.63 mg/dL (ref 0.44–1.00)
GFR calc Af Amer: >60 mL/min (ref >60)
GFR calc non Af Amer: >60 mL/min (ref >60)
Glucose, Bld: 95 mg/dL (ref 70–99)
Potassium: 3.7 mmol/L (ref 3.5–5.1)
Sodium: 138 mmol/L (ref 135–145)

## 2018-12-13 LAB — CULTURE, BLOOD (ROUTINE X 2): Culture: NO GROWTH

## 2018-12-13 LAB — HCV RNA QUANT: HCV Quantitative: NOT DETECTED IU/mL (ref >50)

## 2018-12-13 SURGERY — CANCELLED PROCEDURE
Anesthesia: Monitor Anesthesia Care

## 2018-12-13 MED ORDER — MIDAZOLAM HCL 5 MG/5ML IJ SOLN
INTRAMUSCULAR | Status: AC | PRN
  Administered 2018-12-13: 08:00:00 1 mg via INTRAVENOUS

## 2018-12-13 MED ORDER — LIDOCAINE 5 % EX PTCH
1.0000 | MEDICATED_PATCH | CUTANEOUS | Status: DC
  Administered 2018-12-13: 1 via TRANSDERMAL
  Filled 2018-12-13: qty 1

## 2018-12-13 MED ORDER — MIDAZOLAM HCL (PF) 5 MG/ML IJ SOLN
INTRAMUSCULAR | Status: AC
  Filled 2018-12-13: qty 1

## 2018-12-13 NOTE — H&P (View-Only) (Signed)
Patient ID: Maria BirminghamMisty Noel Kinslow, female   DOB: [birth year], 48 y.o.   MRN: [id number]         [facility]  Date of Admission:  12/08/2018           Day 5 cefazolin ASSESSMENT: She has pneumonia complicated by MSSA bacteremia.  Both blood cultures done at [facility] are positive and 1 of 2 admission blood cultures here is positive.  Repeat blood cultures are negative.  There is no evidence of endocarditis by TTE.  TEE was scheduled for this morning but was canceled because of her ongoing cough.  It has been rescheduled for tomorrow morning.  MRI of her left shoulder did not reveal any evidence of infection.  I suspect that her new right chest wall pain is acute costochondritis secondary to coughing rather than septic arthritis.  PLAN: 1. Continue cefazolin 2. Proceed with PICC placement 3. Await results of TEE  Principal Problem:   Bacteremia due to methicillin susceptible Staphylococcus aureus (MSSA) Active Problems:   Pneumonia   Left shoulder pain   Acute on chronic respiratory failure with hypoxia (HCC)   Hyponatremia   Hypokalemia   COPD (chronic obstructive pulmonary disease) (HCC)   Normocytic anemia   Thrombocytopenia (HCC)   IVDU (intravenous drug user)   History of hepatitis C   Scheduled Meds: . ferrous sulfate  325 mg Oral BID WC  . folic acid  1 mg Oral Daily  . gabapentin  800 mg Oral QID  . ipratropium-albuterol  3 mL Nebulization BID  . multivitamin with minerals  1 tablet Oral Daily  . senna-docusate  2 tablet Oral BID  . sodium chloride flush  10-40 mL Intracatheter Q12H  . thiamine  100 mg Oral Daily   Continuous Infusions: .  ceFAZolin (ANCEF) IV 2 g (12/13/18 0432)   PRN Meds:.ALPRAZolam, guaiFENesin, LORazepam **OR** LORazepam, methocarbamol, oxyCODONE, sodium chloride flush, traMADol   SUBJECTIVE: He still has dry cough.  Her TEE was rescheduled for tomorrow morning because of the cough.  Yesterday she began to  note some tenderness along the right side of her sternum.  Review of Systems: Review of Systems  Constitutional: Negative for chills, diaphoresis and fever.  Respiratory: Positive for cough, shortness of breath and wheezing. Negative for sputum production.   Cardiovascular: Negative for chest pain.  Gastrointestinal: Negative for abdominal pain, diarrhea, nausea and vomiting.  Genitourinary: Negative for dysuria.  Musculoskeletal: Positive for joint pain.  Neurological: Negative for headaches.  Psychiatric/Behavioral: Negative for depression. The patient is nervous/anxious.     Allergies  Allergen Reactions  . Morphine And Related Hives  . Zofran [Ondansetron Hcl] Other (See Comments)    Pt said it made her really sick    OBJECTIVE: Vitals:   12/12/18 2030 12/12/18 2326 12/13/18 0412 12/13/18 0736  BP: 133/81 111/76 (!) 129/95 (!) 139/98  Pulse: 93 88 77 87  Resp: 18 16 14 11   Temp: 98.5 F (36.9 C) 99.1 F (37.3 C) 97.9 F (36.6 C) 98.7 F (37.1 C)  TempSrc: Oral Oral  Oral  SpO2: 95% 92% 97% 93%  Weight:    61 kg  Height:    5\' 5"  (1.651 m)   Body mass index is 22.38 kg/m.  Physical Exam Constitutional:      Comments: She is sitting up in bed.  She is pleasant and talkative.  She is not as anxious.  HENT:     Mouth/Throat:     Comments: Many broken  or missing teeth. Eyes:     Conjunctiva/sclera: Conjunctivae normal.  Cardiovascular:     Rate and Rhythm: Normal rate and regular rhythm.     Heart sounds: No murmur.     Comments: Distant heart sounds. Pulmonary:     Effort: Pulmonary effort is normal.     Breath sounds: Wheezing and rales present.  Chest:    Abdominal:     Palpations: Abdomen is soft.     Tenderness: There is no abdominal tenderness.  Musculoskeletal:        General: Tenderness present. No swelling.     Comments: There is tenderness with palpation of her anterior left shoulder.  There is no unusual warmth, redness or swelling.  Skin:     Findings: No rash.     Lab Results Lab Results  Component Value Date   WBC 4.7 12/11/2018   HGB 10.4 (L) 12/11/2018   HCT 32.2 (L) 12/11/2018   MCV 91.7 12/11/2018   PLT 85 (L) 12/11/2018    Lab Results  Component Value Date   CREATININE 0.63 12/13/2018   BUN <5 (L) 12/13/2018   NA 138 12/13/2018   K 3.7 12/13/2018   CL 100 12/13/2018   CO2 28 12/13/2018    Lab Results  Component Value Date   ALT 22 12/09/2018   AST 30 12/09/2018   ALKPHOS 48 12/09/2018   BILITOT 0.5 12/09/2018     Microbiology: Recent Results (from the past 240 hour(s))  Culture, blood (routine x 2) Call MD if unable to obtain prior to antibiotics being given     Status: Abnormal   Collection Time: 12/08/18 10:20 PM   Specimen: BLOOD LEFT HAND  Result Value Ref Range Status   Specimen Description BLOOD LEFT HAND  Final   Special Requests   Final    BOTTLES DRAWN AEROBIC ONLY Blood Culture results may not be optimal due to an inadequate volume of blood received in culture bottles   Culture  Setup Time   Final    GRAM POSITIVE COCCI IN CLUSTERS AEROBIC BOTTLE ONLY CRITICAL RESULT CALLED TO, READ BACK BY AND VERIFIED WITH: Hessie Diener. Dang PharmD 16:50 12/09/18 ([email or web address]) Performed at [facility], [address] [place], [place] [address]    Culture STAPHYLOCOCCUS AUREUS (A)  Final   Report Status 12/11/2018 FINAL  Final   Organism ID, Bacteria STAPHYLOCOCCUS AUREUS  Final      Susceptibility   Staphylococcus aureus - MIC*    CIPROFLOXACIN <=0.5 SENSITIVE Sensitive     ERYTHROMYCIN >=8 RESISTANT Resistant     GENTAMICIN <=0.5 SENSITIVE Sensitive     OXACILLIN <=0.25 SENSITIVE Sensitive     TETRACYCLINE <=1 SENSITIVE Sensitive     VANCOMYCIN 1 SENSITIVE Sensitive     TRIMETH/SULFA <=10 SENSITIVE Sensitive     CLINDAMYCIN <=0.25 SENSITIVE Sensitive     RIFAMPIN <=0.5 SENSITIVE Sensitive     Inducible Clindamycin NEGATIVE Sensitive     * STAPHYLOCOCCUS AUREUS  Culture, blood (routine x 2) Call MD  if unable to obtain prior to antibiotics being given     Status: None (Preliminary result)   Collection Time: 12/08/18 10:20 PM   Specimen: BLOOD RIGHT ARM  Result Value Ref Range Status   Specimen Description BLOOD RIGHT ARM  Final   Special Requests   Final    BOTTLES DRAWN AEROBIC ONLY Blood Culture results may not be optimal due to an inadequate volume of blood received in culture bottles   Culture  Final    NO GROWTH 4 DAYS Performed at [facility], [address] [place], [place] [address]    Report Status PENDING  Incomplete  Blood Culture ID Panel (Reflexed)     Status: Abnormal   Collection Time: 12/08/18 10:20 PM  Result Value Ref Range Status   Enterococcus species NOT DETECTED NOT DETECTED Final   Listeria monocytogenes NOT DETECTED NOT DETECTED Final   Staphylococcus species DETECTED (A) NOT DETECTED Final    Comment: CRITICAL RESULT CALLED TO, READ BACK BY AND VERIFIED WITH: Hessie Diener. Dang PharmD 15:50 12/09/18 ([email or web address])    Staphylococcus aureus (BCID) DETECTED (A) NOT DETECTED Final    Comment: Methicillin (oxacillin) susceptible Staphylococcus aureus (MSSA). Preferred therapy is anti staphylococcal beta lactam antibiotic (Cefazolin or Nafcillin), unless clinically contraindicated. CRITICAL RESULT CALLED TO, READ BACK BY AND VERIFIED WITH: Hessie Diener. Dang PharmD 15:50 12/09/18 ([email or web address])    Methicillin resistance NOT DETECTED NOT DETECTED Final   Streptococcus species NOT DETECTED NOT DETECTED Final   Streptococcus agalactiae NOT DETECTED NOT DETECTED Final   Streptococcus pneumoniae NOT DETECTED NOT DETECTED Final   Streptococcus pyogenes NOT DETECTED NOT DETECTED Final   Acinetobacter baumannii NOT DETECTED NOT DETECTED Final   Enterobacteriaceae species NOT DETECTED NOT DETECTED Final   Enterobacter cloacae complex NOT DETECTED NOT DETECTED Final   Escherichia coli NOT DETECTED NOT DETECTED Final   Klebsiella oxytoca NOT DETECTED NOT DETECTED Final   Klebsiella pneumoniae  NOT DETECTED NOT DETECTED Final   Proteus species NOT DETECTED NOT DETECTED Final   Serratia marcescens NOT DETECTED NOT DETECTED Final   Haemophilus influenzae NOT DETECTED NOT DETECTED Final   Neisseria meningitidis NOT DETECTED NOT DETECTED Final   Pseudomonas aeruginosa NOT DETECTED NOT DETECTED Final   Candida albicans NOT DETECTED NOT DETECTED Final   Candida glabrata NOT DETECTED NOT DETECTED Final   Candida krusei NOT DETECTED NOT DETECTED Final   Candida parapsilosis NOT DETECTED NOT DETECTED Final   Candida tropicalis NOT DETECTED NOT DETECTED Final    Comment: Performed at [facility], [address] [place], [place] [address]  SARS Coronavirus 2 (CEPHEID- Performed in [facility] hospital lab), Hosp Order     Status: None   Collection Time: 12/08/18 11:45 PM   Specimen: Nasopharyngeal Swab  Result Value Ref Range Status   SARS Coronavirus 2 NEGATIVE NEGATIVE Final    Comment: (NOTE) If result is NEGATIVE SARS-CoV-2 target nucleic acids are NOT DETECTED. The SARS-CoV-2 RNA is generally detectable in upper and lower  respiratory specimens during the acute phase of infection. The lowest  concentration of SARS-CoV-2 viral copies this assay can detect is 250  copies / mL. A negative result does not preclude SARS-CoV-2 infection  and should not be used as the sole basis for treatment or other  patient management decisions.  A negative result may occur with  improper specimen collection / handling, submission of specimen other  than nasopharyngeal swab, presence of viral mutation(s) within the  areas targeted by this assay, and inadequate number of viral copies  (<250 copies / mL). A negative result must be combined with clinical  observations, patient history, and epidemiological information. If result is POSITIVE SARS-CoV-2 target nucleic acids are DETECTED. The SARS-CoV-2 RNA is generally detectable in upper and lower  respiratory specimens dur ing the acute phase  of infection.  Positive  results are indicative of active infection with SARS-CoV-2.  Clinical  correlation with patient history and other diagnostic information is  necessary  to determine patient infection status.  Positive results do  not rule out bacterial infection or co-infection with other viruses. If result is PRESUMPTIVE POSTIVE SARS-CoV-2 nucleic acids MAY BE PRESENT.   A presumptive positive result was obtained on the submitted specimen  and confirmed on repeat testing.  While 2019 novel coronavirus  (SARS-CoV-2) nucleic acids may be present in the submitted sample  additional confirmatory testing may be necessary for epidemiological  and / or clinical management purposes  to differentiate between  SARS-CoV-2 and other Sarbecovirus currently known to infect humans.  If clinically indicated additional testing with an alternate test  methodology [phone]) is advised. The SARS-CoV-2 RNA is generally  detectable in upper and lower respiratory sp ecimens during the acute  phase of infection. The expected result is Negative. Fact Sheet for Patients:  StrictlyIdeas.no Fact Sheet for Healthcare Providers: BankingDealers.co.za This test is not yet approved or cleared by the [place] FDA and has been authorized for detection and/or diagnosis of SARS-CoV-2 by FDA under an Emergency Use Authorization (EUA).  This EUA will remain in effect (meaning this test can be used) for the duration of the COVID-19 declaration under Section 564(b)(1) of the Act, 21 U.S.C. section 360bbb-3(b)(1), unless the authorization is terminated or revoked sooner. Performed at [facility], [place] [address] [place], [place] [address]   MRSA PCR Screening     Status: None   Collection Time: 12/09/18 12:10 AM   Specimen: Nasopharyngeal  Result Value Ref Range Status   MRSA by PCR NEGATIVE NEGATIVE Final    Comment:        The GeneXpert MRSA Assay (FDA  approved for NASAL specimens only), is one component of a comprehensive MRSA colonization surveillance program. It is not intended to diagnose MRSA infection nor to guide or monitor treatment for MRSA infections. Performed at [facility], [place] [address] [place], [place] [address]   Culture, blood (routine x 2)     Status: None (Preliminary result)   Collection Time: 12/09/18  9:47 PM   Specimen: BLOOD  Result Value Ref Range Status   Specimen Description BLOOD LEFT HAND  Final   Special Requests   Final    BOTTLES DRAWN AEROBIC ONLY Blood Culture results may not be optimal due to an inadequate volume of blood received in culture bottles   Culture   Final    NO GROWTH 3 DAYS Performed at [facility], [place] [address], [place], [place] [address]    Report Status PENDING  Incomplete  Culture, blood (routine x 2)     Status: None (Preliminary result)   Collection Time: 12/09/18  9:55 PM   Specimen: BLOOD  Result Value Ref Range Status   Specimen Description BLOOD RIGHT HAND  Final   Special Requests   Final    BOTTLES DRAWN AEROBIC AND ANAEROBIC Blood Culture results may not be optimal due to an inadequate volume of blood received in culture bottles   Culture   Final    NO GROWTH 3 DAYS Performed at [facility], [place] [address], [place], [place] [address]    Report Status PENDING  Incomplete    Michel Bickers, MD [facility] [phone] pager   [phone] cell 12/13/2018, 9:58 AM

## 2018-12-13 NOTE — Progress Notes (Signed)
PROGRESS NOTE    [name]  XVQ:[id number] DOB: September 12, 1970 DOA: 12/08/2018 PCP: No primary care provider on file.    Brief Narrative:   [name] is a 48 y.o. female with medical history significant of IV drug abuse, emphysema who presented to [facility] with acute respiratory failure and hypoxemia.   Assessment & Plan:   Principal Problem:   Bacteremia due to methicillin susceptible Staphylococcus aureus (MSSA) Active Problems:   Acute on chronic respiratory failure with hypoxia (HCC)   Pneumonia   Hyponatremia   Hypokalemia   COPD (chronic obstructive pulmonary disease) (HCC)   Left shoulder pain   Normocytic anemia   Thrombocytopenia (HCC)   IVDU (intravenous drug user)   History of hepatitis C   Acute respiratory failure with hypoxia Secondary to community acquired pneumonia/ right upper lobe pneumonia.  CT angio negative for PE. Repeat CXR does not show any pneumonia.  Continue with IV antibiotics/ ancef and bronchodilators as needed.  She was weaned off oxygen.  No wheezing heard today.  Prn bronchodilators.    MSSA bacteremia:  On ancef, TTE does not show any endocarditis.  Repeat cultures are pending and negative so far. Appreciate ID recommendations.   TEE rescheduled for tomorrow.  PICC line today.  Continue with ANCEF.   Hypokalemia: replaced.   Hypomagnesemia:  Replaced.    Anemia of chronic disease:  Anemia panel ordered. Shows low iron levels, will add iron supplementation.    Thrombocytopenia:  Probably from hepatitis C.  No signs of bleeding.     Hyponatremia:  Resolved with hydration.    Heavy alcohol abuse:  With some withdrawal symptoms.  CIWA on board.    Cocaine abuse UDS ordered, positive for benzodiazepines and opiates. Will get CSW for resources.    Chronic pain syndrome:  Restarted her home Meds.   H/o hepatitis C:  Outpatient follow up    Left shoulder pain:  X rays are non  diagnostic  She is unable to lift the arm. But able to use the forearm.  No signs of cellulitis or swelling around the shoulder joint.  MRI of the shoulder without contrast for further evaluation,it showed intact rotator cuff, but significant bursitis.     Right sided chest wall pain Tender to palpation, possibly musculoskeletal pain , costochondritis.  Pain control.     DVT prophylaxis: scd's Code Status: full code.  Family Communication: none at bedside.  Disposition Plan: pending clinical improvement.    Consultants:   ID  Procedures: TEE  Antimicrobials: ancef from 7/3  Subjective:  She is in good spirits, no new complaints. Pain in the right side of the chest improving.    Objective: Vitals:   12/12/18 2326 12/13/18 0412 12/13/18 0736 12/13/18 1137  BP: 111/76 (!) 129/95 (!) 139/98 (!) 147/88  Pulse: 88 77 87   Resp: 16 14 11    Temp: 99.1 F (37.3 C) 97.9 F (36.6 C) 98.7 F (37.1 C) 97.7 F (36.5 C)  TempSrc: Oral  Oral Oral  SpO2: 92% 97% 93% 98%  Weight:   61 kg   Height:   5\' 5"  (1.651 m)     Intake/Output Summary (Last 24 hours) at 12/13/2018 1329 Last data filed at 12/12/2018 2325 Gross per 24 hour  Intake 370 ml  Output 1000 ml  Net -630 ml   Filed Weights   12/08/18 2000 12/08/18 2030 12/13/18 0736  Weight: 61.7 kg 61 kg 61 kg    Examination:  General exam:  not in distress Respiratory system: no wheezin gheard Cardiovascular system: S1 & S2 heard, RRR. No JVD or pedal edema.  Gastrointestinal system: Abdomen is soft NT ND BS+ Central nervous system: Alert and oriented. No focal neurological deficits. Extremities: no pedal edema.  Able to use her left arm without any issues.  Skin: No rashes, lesions or ulcers Psychiatry:  Mood Is okay.     Data Reviewed: I have personally reviewed following labs and imaging studies  CBC: Recent Labs  Lab 12/08/18 2223 12/09/18 0946 12/11/18 0718  WBC 4.3 3.4* 4.7  NEUTROABS 3.5  --   --    HGB 11.1* 10.2* 10.4*  HCT 32.8* 30.0* 32.2*  MCV 89.1 88.0 91.7  PLT 53* 45* 85*   Basic Metabolic Panel: Recent Labs  Lab 12/08/18 2223 12/09/18 0946 12/11/18 0718 12/13/18 0612  NA 130* 137 137 138  K 3.2* 4.3 4.2 3.7  CL 93* 102 102 100  CO2 26 24 25 28   GLUCOSE 122* 158* 130* 95  BUN 8 10 9  <5*  CREATININE 0.78 0.50 0.53 0.63  CALCIUM 8.1* 8.1* 7.8* 8.4*  MG  --  2.3  --   --    GFR: Estimated Creatinine Clearance: 79.9 mL/min (by C-G formula based on SCr of 0.63 mg/dL). Liver Function Tests: Recent Labs  Lab 12/08/18 2223 12/09/18 0946  AST 21 30  ALT 18 22  ALKPHOS 46 48  BILITOT 1.0 0.5  PROT 7.0 6.2*  ALBUMIN 3.2* 2.7*   No results for input(s): LIPASE, AMYLASE in the last 168 hours. No results for input(s): AMMONIA in the last 168 hours. Coagulation Profile: No results for input(s): INR, PROTIME in the last 168 hours. Cardiac Enzymes: No results for input(s): CKTOTAL, CKMB, CKMBINDEX, TROPONINI in the last 168 hours. BNP (last 3 results) No results for input(s): PROBNP in the last 8760 hours. HbA1C: No results for input(s): HGBA1C in the last 72 hours. CBG: Recent Labs  Lab 12/08/18 2235  GLUCAP 110*   Lipid Profile: No results for input(s): CHOL, HDL, LDLCALC, TRIG, CHOLHDL, LDLDIRECT in the last 72 hours. Thyroid Function Tests: No results for input(s): TSH, T4TOTAL, FREET4, T3FREE, THYROIDAB in the last 72 hours. Anemia Panel: No results for input(s): VITAMINB12, FOLATE, FERRITIN, TIBC, IRON, RETICCTPCT in the last 72 hours. Sepsis Labs: Recent Labs  Lab 12/09/18 1252  PROCALCITON 5.90  LATICACIDVEN 1.3    Recent Results (from the past 240 hour(s))  Culture, blood (routine x 2) Call MD if unable to obtain prior to antibiotics being given     Status: Abnormal   Collection Time: 12/08/18 10:20 PM   Specimen: BLOOD LEFT HAND  Result Value Ref Range Status   Specimen Description BLOOD LEFT HAND  Final   Special Requests   Final     BOTTLES DRAWN AEROBIC ONLY Blood Culture results may not be optimal due to an inadequate volume of blood received in culture bottles   Culture  Setup Time   Final    GRAM POSITIVE COCCI IN CLUSTERS AEROBIC BOTTLE ONLY CRITICAL RESULT CALLED TO, READ BACK BY AND VERIFIED WITH: Hessie Diener. Dang PharmD 16:50 12/09/18 ([email or web address]) Performed at [facility], [address] [place], [place] [address]    Culture STAPHYLOCOCCUS AUREUS (A)  Final   Report Status 12/11/2018 FINAL  Final   Organism ID, Bacteria STAPHYLOCOCCUS AUREUS  Final      Susceptibility   Staphylococcus aureus - MIC*    CIPROFLOXACIN <=0.5 SENSITIVE Sensitive  ERYTHROMYCIN >=8 RESISTANT Resistant     GENTAMICIN <=0.5 SENSITIVE Sensitive     OXACILLIN <=0.25 SENSITIVE Sensitive     TETRACYCLINE <=1 SENSITIVE Sensitive     VANCOMYCIN 1 SENSITIVE Sensitive     TRIMETH/SULFA <=10 SENSITIVE Sensitive     CLINDAMYCIN <=0.25 SENSITIVE Sensitive     RIFAMPIN <=0.5 SENSITIVE Sensitive     Inducible Clindamycin NEGATIVE Sensitive     * STAPHYLOCOCCUS AUREUS  Culture, blood (routine x 2) Call MD if unable to obtain prior to antibiotics being given     Status: None   Collection Time: 12/08/18 10:20 PM   Specimen: BLOOD RIGHT ARM  Result Value Ref Range Status   Specimen Description BLOOD RIGHT ARM  Final   Special Requests   Final    BOTTLES DRAWN AEROBIC ONLY Blood Culture results may not be optimal due to an inadequate volume of blood received in culture bottles   Culture   Final    NO GROWTH 5 DAYS Performed at [facility], [address] [place], [place] [address]    Report Status 12/13/2018 FINAL  Final  Blood Culture ID Panel (Reflexed)     Status: Abnormal   Collection Time: 12/08/18 10:20 PM  Result Value Ref Range Status   Enterococcus species NOT DETECTED NOT DETECTED Final   Listeria monocytogenes NOT DETECTED NOT DETECTED Final   Staphylococcus species DETECTED (A) NOT DETECTED Final    Comment: CRITICAL  RESULT CALLED TO, READ BACK BY AND VERIFIED WITH: Hessie Diener PharmD 15:50 12/09/18 ([email or web address])    Staphylococcus aureus (BCID) DETECTED (A) NOT DETECTED Final    Comment: Methicillin (oxacillin) susceptible Staphylococcus aureus (MSSA). Preferred therapy is anti staphylococcal beta lactam antibiotic (Cefazolin or Nafcillin), unless clinically contraindicated. CRITICAL RESULT CALLED TO, READ BACK BY AND VERIFIED WITH: Hessie Diener PharmD 15:50 12/09/18 ([email or web address])    Methicillin resistance NOT DETECTED NOT DETECTED Final   Streptococcus species NOT DETECTED NOT DETECTED Final   Streptococcus agalactiae NOT DETECTED NOT DETECTED Final   Streptococcus pneumoniae NOT DETECTED NOT DETECTED Final   Streptococcus pyogenes NOT DETECTED NOT DETECTED Final   Acinetobacter baumannii NOT DETECTED NOT DETECTED Final   Enterobacteriaceae species NOT DETECTED NOT DETECTED Final   Enterobacter cloacae complex NOT DETECTED NOT DETECTED Final   Escherichia coli NOT DETECTED NOT DETECTED Final   Klebsiella oxytoca NOT DETECTED NOT DETECTED Final   Klebsiella pneumoniae NOT DETECTED NOT DETECTED Final   Proteus species NOT DETECTED NOT DETECTED Final   Serratia marcescens NOT DETECTED NOT DETECTED Final   Haemophilus influenzae NOT DETECTED NOT DETECTED Final   Neisseria meningitidis NOT DETECTED NOT DETECTED Final   Pseudomonas aeruginosa NOT DETECTED NOT DETECTED Final   Candida albicans NOT DETECTED NOT DETECTED Final   Candida glabrata NOT DETECTED NOT DETECTED Final   Candida krusei NOT DETECTED NOT DETECTED Final   Candida parapsilosis NOT DETECTED NOT DETECTED Final   Candida tropicalis NOT DETECTED NOT DETECTED Final    Comment: Performed at [facility], [address], [place], [place] [address]  SARS Coronavirus 2 (CEPHEID- Performed in [facility] lab), Hosp Order     Status: None   Collection Time: 12/08/18 11:45 PM   Specimen: Nasopharyngeal Swab  Result Value Ref Range Status   SARS  Coronavirus 2 NEGATIVE NEGATIVE Final    Comment: (NOTE) If result is NEGATIVE SARS-CoV-2 target nucleic acids are NOT DETECTED. The SARS-CoV-2 RNA is generally detectable in upper and lower  respiratory specimens  during the acute phase of infection. The lowest  concentration of SARS-CoV-2 viral copies this assay can detect is 250  copies / mL. A negative result does not preclude SARS-CoV-2 infection  and should not be used as the sole basis for treatment or other  patient management decisions.  A negative result may occur with  improper specimen collection / handling, submission of specimen other  than nasopharyngeal swab, presence of viral mutation(s) within the  areas targeted by this assay, and inadequate number of viral copies  (<250 copies / mL). A negative result must be combined with clinical  observations, patient history, and epidemiological information. If result is POSITIVE SARS-CoV-2 target nucleic acids are DETECTED. The SARS-CoV-2 RNA is generally detectable in upper and lower  respiratory specimens dur ing the acute phase of infection.  Positive  results are indicative of active infection with SARS-CoV-2.  Clinical  correlation with patient history and other diagnostic information is  necessary to determine patient infection status.  Positive results do  not rule out bacterial infection or co-infection with other viruses. If result is PRESUMPTIVE POSTIVE SARS-CoV-2 nucleic acids MAY BE PRESENT.   A presumptive positive result was obtained on the submitted specimen  and confirmed on repeat testing.  While 2019 novel coronavirus  (SARS-CoV-2) nucleic acids may be present in the submitted sample  additional confirmatory testing may be necessary for epidemiological  and / or clinical management purposes  to differentiate between  SARS-CoV-2 and other Sarbecovirus currently known to infect humans.  If clinically indicated additional testing with an alternate test   methodology [phone]([id number]) is advised. The SARS-CoV-2 RNA is generally  detectable in upper and lower respiratory sp ecimens during the acute  phase of infection. The expected result is Negative. Fact Sheet for Patients:  BoilerBrush.com.cyhttps://www.fda.gov/media/136312/download Fact Sheet for Healthcare Providers: https://pope.com/https://www.fda.gov/media/136313/download This test is not yet approved or cleared by the [place] FDA and has been authorized for detection and/or diagnosis of SARS-CoV-2 by FDA under an Emergency Use Authorization (EUA).  This EUA will remain in effect (meaning this test can be used) for the duration of the COVID-19 declaration under Section 564(b)(1) of the Act, 21 U.S.C. section 360bbb-3(b)(1), unless the authorization is terminated or revoked sooner. Performed at [facility], [address] [place], [place] [address]   MRSA PCR Screening     Status: None   Collection Time: 12/09/18 12:10 AM   Specimen: Nasopharyngeal  Result Value Ref Range Status   MRSA by PCR NEGATIVE NEGATIVE Final    Comment:        The GeneXpert MRSA Assay (FDA approved for NASAL specimens only), is one component of a comprehensive MRSA colonization surveillance program. It is not intended to diagnose MRSA infection nor to guide or monitor treatment for MRSA infections. Performed at [facility], [address] [place], [place] [address]   Culture, blood (routine x 2)     Status: None (Preliminary result)   Collection Time: 12/09/18  9:47 PM   Specimen: BLOOD  Result Value Ref Range Status   Specimen Description BLOOD LEFT HAND  Final   Special Requests   Final    BOTTLES DRAWN AEROBIC ONLY Blood Culture results may not be optimal due to an inadequate volume of blood received in culture bottles   Culture   Final    NO GROWTH 4 DAYS Performed at [facility], [address] [place], [place] [address]    Report Status PENDING  Incomplete  Culture, blood (routine x 2)      Status: None (Preliminary result)   Collection Time: 12/09/18  9:55 PM   Specimen: BLOOD  Result Value Ref Range Status   Specimen Description BLOOD RIGHT HAND  Final   Special Requests   Final    BOTTLES DRAWN AEROBIC AND ANAEROBIC Blood Culture results may not be optimal due to an inadequate volume of blood received in culture bottles   Culture   Final    NO GROWTH 4 DAYS Performed at [facility], [address] [place], [place] [address]    Report Status PENDING  Incomplete         Radiology Studies: No results found.      Scheduled Meds: . ferrous sulfate  325 mg Oral BID WC  . folic acid  1 mg Oral Daily  . gabapentin  800 mg Oral QID  . ipratropium-albuterol  3 mL Nebulization BID  . multivitamin with minerals  1 tablet Oral Daily  . senna-docusate  2 tablet Oral BID  . sodium chloride flush  10-40 mL Intracatheter Q12H  . thiamine  100 mg Oral Daily   Continuous Infusions: .  ceFAZolin (ANCEF) IV 2 g (12/13/18 1240)     LOS: 5 days    Time spent: 35 minutes.     Kathlen ModyVijaya Jarry Manon, MD [facility] Pager [phone]   If 7PM-7AM, please contact night-coverage [email or web address] Password [email or web address] 12/13/2018, 1:29 PM

## 2018-12-13 NOTE — Progress Notes (Signed)
Patient ID: Maria BirminghamMisty Noel Chandler, female   DOB: [birth year], 48 y.o.   MRN: [id number]         [facility]  Date of Admission:  12/08/2018           Day 5 cefazolin ASSESSMENT: She has pneumonia complicated by MSSA bacteremia.  Both blood cultures done at [facility] are positive and 1 of 2 admission blood cultures here is positive.  Repeat blood cultures are negative.  There is no evidence of endocarditis by TTE.  TEE was scheduled for this morning but was canceled because of her ongoing cough.  It has been rescheduled for tomorrow morning.  MRI of her left shoulder did not reveal any evidence of infection.  I suspect that her new right chest wall pain is acute costochondritis secondary to coughing rather than septic arthritis.  PLAN: 1. Continue cefazolin 2. Proceed with PICC placement 3. Await results of TEE  Principal Problem:   Bacteremia due to methicillin susceptible Staphylococcus aureus (MSSA) Active Problems:   Pneumonia   Left shoulder pain   Acute on chronic respiratory failure with hypoxia (HCC)   Hyponatremia   Hypokalemia   COPD (chronic obstructive pulmonary disease) (HCC)   Normocytic anemia   Thrombocytopenia (HCC)   IVDU (intravenous drug user)   History of hepatitis C   Scheduled Meds: . ferrous sulfate  325 mg Oral BID WC  . folic acid  1 mg Oral Daily  . gabapentin  800 mg Oral QID  . ipratropium-albuterol  3 mL Nebulization BID  . multivitamin with minerals  1 tablet Oral Daily  . senna-docusate  2 tablet Oral BID  . sodium chloride flush  10-40 mL Intracatheter Q12H  . thiamine  100 mg Oral Daily   Continuous Infusions: .  ceFAZolin (ANCEF) IV 2 g (12/13/18 0432)   PRN Meds:.ALPRAZolam, guaiFENesin, LORazepam **OR** LORazepam, methocarbamol, oxyCODONE, sodium chloride flush, traMADol   SUBJECTIVE: He still has dry cough.  Her TEE was rescheduled for tomorrow morning because of the cough.  Yesterday she began to  note some tenderness along the right side of her sternum.  Review of Systems: Review of Systems  Constitutional: Negative for chills, diaphoresis and fever.  Respiratory: Positive for cough, shortness of breath and wheezing. Negative for sputum production.   Cardiovascular: Negative for chest pain.  Gastrointestinal: Negative for abdominal pain, diarrhea, nausea and vomiting.  Genitourinary: Negative for dysuria.  Musculoskeletal: Positive for joint pain.  Neurological: Negative for headaches.  Psychiatric/Behavioral: Negative for depression. The patient is nervous/anxious.     Allergies  Allergen Reactions  . Morphine And Related Hives  . Zofran [Ondansetron Hcl] Other (See Comments)    Pt said it made her really sick    OBJECTIVE: Vitals:   12/12/18 2030 12/12/18 2326 12/13/18 0412 12/13/18 0736  BP: 133/81 111/76 (!) 129/95 (!) 139/98  Pulse: 93 88 77 87  Resp: 18 16 14 11   Temp: 98.5 F (36.9 C) 99.1 F (37.3 C) 97.9 F (36.6 C) 98.7 F (37.1 C)  TempSrc: Oral Oral  Oral  SpO2: 95% 92% 97% 93%  Weight:    61 kg  Height:    5\' 5"  (1.651 m)   Body mass index is 22.38 kg/m.  Physical Exam Constitutional:      Comments: She is sitting up in bed.  She is pleasant and talkative.  She is not as anxious.  HENT:     Mouth/Throat:     Comments: Many broken  or missing teeth. Eyes:     Conjunctiva/sclera: Conjunctivae normal.  Cardiovascular:     Rate and Rhythm: Normal rate and regular rhythm.     Heart sounds: No murmur.     Comments: Distant heart sounds. Pulmonary:     Effort: Pulmonary effort is normal.     Breath sounds: Wheezing and rales present.  Chest:    Abdominal:     Palpations: Abdomen is soft.     Tenderness: There is no abdominal tenderness.  Musculoskeletal:        General: Tenderness present. No swelling.     Comments: There is tenderness with palpation of her anterior left shoulder.  There is no unusual warmth, redness or swelling.  Skin:     Findings: No rash.     Lab Results Lab Results  Component Value Date   WBC 4.7 12/11/2018   HGB 10.4 (L) 12/11/2018   HCT 32.2 (L) 12/11/2018   MCV 91.7 12/11/2018   PLT 85 (L) 12/11/2018    Lab Results  Component Value Date   CREATININE 0.63 12/13/2018   BUN <5 (L) 12/13/2018   NA 138 12/13/2018   K 3.7 12/13/2018   CL 100 12/13/2018   CO2 28 12/13/2018    Lab Results  Component Value Date   ALT 22 12/09/2018   AST 30 12/09/2018   ALKPHOS 48 12/09/2018   BILITOT 0.5 12/09/2018     Microbiology: Recent Results (from the past 240 hour(s))  Culture, blood (routine x 2) Call MD if unable to obtain prior to antibiotics being given     Status: Abnormal   Collection Time: 12/08/18 10:20 PM   Specimen: BLOOD LEFT HAND  Result Value Ref Range Status   Specimen Description BLOOD LEFT HAND  Final   Special Requests   Final    BOTTLES DRAWN AEROBIC ONLY Blood Culture results may not be optimal due to an inadequate volume of blood received in culture bottles   Culture  Setup Time   Final    GRAM POSITIVE COCCI IN CLUSTERS AEROBIC BOTTLE ONLY CRITICAL RESULT CALLED TO, READ BACK BY AND VERIFIED WITH: Hessie Diener. Dang PharmD 16:50 12/09/18 ([email or web address]) Performed at [facility], [address] [place], [place] [address]    Culture STAPHYLOCOCCUS AUREUS (A)  Final   Report Status 12/11/2018 FINAL  Final   Organism ID, Bacteria STAPHYLOCOCCUS AUREUS  Final      Susceptibility   Staphylococcus aureus - MIC*    CIPROFLOXACIN <=0.5 SENSITIVE Sensitive     ERYTHROMYCIN >=8 RESISTANT Resistant     GENTAMICIN <=0.5 SENSITIVE Sensitive     OXACILLIN <=0.25 SENSITIVE Sensitive     TETRACYCLINE <=1 SENSITIVE Sensitive     VANCOMYCIN 1 SENSITIVE Sensitive     TRIMETH/SULFA <=10 SENSITIVE Sensitive     CLINDAMYCIN <=0.25 SENSITIVE Sensitive     RIFAMPIN <=0.5 SENSITIVE Sensitive     Inducible Clindamycin NEGATIVE Sensitive     * STAPHYLOCOCCUS AUREUS  Culture, blood (routine x 2) Call MD  if unable to obtain prior to antibiotics being given     Status: None (Preliminary result)   Collection Time: 12/08/18 10:20 PM   Specimen: BLOOD RIGHT ARM  Result Value Ref Range Status   Specimen Description BLOOD RIGHT ARM  Final   Special Requests   Final    BOTTLES DRAWN AEROBIC ONLY Blood Culture results may not be optimal due to an inadequate volume of blood received in culture bottles   Culture  Final    NO GROWTH 4 DAYS Performed at [facility], [address] [place], [place] [address]    Report Status PENDING  Incomplete  Blood Culture ID Panel (Reflexed)     Status: Abnormal   Collection Time: 12/08/18 10:20 PM  Result Value Ref Range Status   Enterococcus species NOT DETECTED NOT DETECTED Final   Listeria monocytogenes NOT DETECTED NOT DETECTED Final   Staphylococcus species DETECTED (A) NOT DETECTED Final    Comment: CRITICAL RESULT CALLED TO, READ BACK BY AND VERIFIED WITH: T. Dang PharmD 15:50 12/09/18 ([email or web address])    Staphylococcus aureus (BCID) DETECTED (A) NOT DETECTED Final    Comment: Methicillin (oxacillin) susceptible Staphylococcus aureus (MSSA). Preferred therapy is anti staphylococcal beta lactam antibiotic (Cefazolin or Nafcillin), unless clinically contraindicated. CRITICAL RESULT CALLED TO, READ BACK BY AND VERIFIED WITH: T. Dang PharmD 15:50 12/09/18 ([email or web address])    Methicillin resistance NOT DETECTED NOT DETECTED Final   Streptococcus species NOT DETECTED NOT DETECTED Final   Streptococcus agalactiae NOT DETECTED NOT DETECTED Final   Streptococcus pneumoniae NOT DETECTED NOT DETECTED Final   Streptococcus pyogenes NOT DETECTED NOT DETECTED Final   Acinetobacter baumannii NOT DETECTED NOT DETECTED Final   Enterobacteriaceae species NOT DETECTED NOT DETECTED Final   Enterobacter cloacae complex NOT DETECTED NOT DETECTED Final   Escherichia coli NOT DETECTED NOT DETECTED Final   Klebsiella oxytoca NOT DETECTED NOT DETECTED Final   Klebsiella pneumoniae  NOT DETECTED NOT DETECTED Final   Proteus species NOT DETECTED NOT DETECTED Final   Serratia marcescens NOT DETECTED NOT DETECTED Final   Haemophilus influenzae NOT DETECTED NOT DETECTED Final   Neisseria meningitidis NOT DETECTED NOT DETECTED Final   Pseudomonas aeruginosa NOT DETECTED NOT DETECTED Final   Candida albicans NOT DETECTED NOT DETECTED Final   Candida glabrata NOT DETECTED NOT DETECTED Final   Candida krusei NOT DETECTED NOT DETECTED Final   Candida parapsilosis NOT DETECTED NOT DETECTED Final   Candida tropicalis NOT DETECTED NOT DETECTED Final    Comment: Performed at [facility], [address] [place], [place] [address]  SARS Coronavirus 2 (CEPHEID- Performed in [place] [facility] lab), Hosp Order     Status: None   Collection Time: 12/08/18 11:45 PM   Specimen: Nasopharyngeal Swab  Result Value Ref Range Status   SARS Coronavirus 2 NEGATIVE NEGATIVE Final    Comment: (NOTE) If result is NEGATIVE SARS-CoV-2 target nucleic acids are NOT DETECTED. The SARS-CoV-2 RNA is generally detectable in upper and lower  respiratory specimens during the acute phase of infection. The lowest  concentration of SARS-CoV-2 viral copies this assay can detect is 250  copies / mL. A negative result does not preclude SARS-CoV-2 infection  and should not be used as the sole basis for treatment or other  patient management decisions.  A negative result may occur with  improper specimen collection / handling, submission of specimen other  than nasopharyngeal swab, presence of viral mutation(s) within the  areas targeted by this assay, and inadequate number of viral copies  (<250 copies / mL). A negative result must be combined with clinical  observations, patient history, and epidemiological information. If result is POSITIVE SARS-CoV-2 target nucleic acids are DETECTED. The SARS-CoV-2 RNA is generally detectable in upper and lower  respiratory specimens dur ing the acute phase  of infection.  Positive  results are indicative of active infection with SARS-CoV-2.  Clinical  correlation with patient history and other diagnostic information is  necessary   to determine patient infection status.  Positive results do  not rule out bacterial infection or co-infection with other viruses. If result is PRESUMPTIVE POSTIVE SARS-CoV-2 nucleic acids MAY BE PRESENT.   A presumptive positive result was obtained on the submitted specimen  and confirmed on repeat testing.  While 2019 novel coronavirus  (SARS-CoV-2) nucleic acids may be present in the submitted sample  additional confirmatory testing may be necessary for epidemiological  and / or clinical management purposes  to differentiate between  SARS-CoV-2 and other Sarbecovirus currently known to infect humans.  If clinically indicated additional testing with an alternate test  methodology [phone]) is advised. The SARS-CoV-2 RNA is generally  detectable in upper and lower respiratory sp ecimens during the acute  phase of infection. The expected result is Negative. Fact Sheet for Patients:  StrictlyIdeas.no Fact Sheet for Healthcare Providers: BankingDealers.co.za This test is not yet approved or cleared by the [place] FDA and has been authorized for detection and/or diagnosis of SARS-CoV-2 by FDA under an Emergency Use Authorization (EUA).  This EUA will remain in effect (meaning this test can be used) for the duration of the COVID-19 declaration under Section 564(b)(1) of the Act, 21 U.S.C. section 360bbb-3(b)(1), unless the authorization is terminated or revoked sooner. Performed at [facility], [place] [address] [place], [place] [address]   MRSA PCR Screening     Status: None   Collection Time: 12/09/18 12:10 AM   Specimen: Nasopharyngeal  Result Value Ref Range Status   MRSA by PCR NEGATIVE NEGATIVE Final    Comment:        The GeneXpert MRSA Assay (FDA  approved for NASAL specimens only), is one component of a comprehensive MRSA colonization surveillance program. It is not intended to diagnose MRSA infection nor to guide or monitor treatment for MRSA infections. Performed at [facility], [place] [address] [place], [place] [address]   Culture, blood (routine x 2)     Status: None (Preliminary result)   Collection Time: 12/09/18  9:47 PM   Specimen: BLOOD  Result Value Ref Range Status   Specimen Description BLOOD LEFT HAND  Final   Special Requests   Final    BOTTLES DRAWN AEROBIC ONLY Blood Culture results may not be optimal due to an inadequate volume of blood received in culture bottles   Culture   Final    NO GROWTH 3 DAYS Performed at [facility], [place] [address], [place], [place] [address]    Report Status PENDING  Incomplete  Culture, blood (routine x 2)     Status: None (Preliminary result)   Collection Time: 12/09/18  9:55 PM   Specimen: BLOOD  Result Value Ref Range Status   Specimen Description BLOOD RIGHT HAND  Final   Special Requests   Final    BOTTLES DRAWN AEROBIC AND ANAEROBIC Blood Culture results may not be optimal due to an inadequate volume of blood received in culture bottles   Culture   Final    NO GROWTH 3 DAYS Performed at [facility], [place] [address], [place], [place] [address]    Report Status PENDING  Incomplete    Michel Bickers, MD [facility] [phone] pager   [phone] cell 12/13/2018, 9:58 AM

## 2018-12-13 NOTE — Progress Notes (Signed)
Patient alert and oriented x 4.  Frequent request for pain and anxiety medications.  NPO since midnight for scheduled procedure.  Patient has decreased oxygen saturation during sleep post pain medications, 74%.  Levels increase with arousal, stimulation and 4 Liters oxygen.  Stable condition at shift change, will continue to monitor.

## 2018-12-14 ENCOUNTER — Inpatient Hospital Stay (HOSPITAL_COMMUNITY): Payer: Medicaid Other | Admitting: Certified Registered Nurse Anesthetist

## 2018-12-14 ENCOUNTER — Inpatient Hospital Stay (HOSPITAL_COMMUNITY): Payer: Medicaid Other

## 2018-12-14 ENCOUNTER — Encounter (HOSPITAL_COMMUNITY)
Admission: AD | Disposition: A | Discharge status: Home Health | Payer: Self-pay | Source: Other Acute Inpatient Hospital | Attending: Internal Medicine

## 2018-12-14 ENCOUNTER — Encounter (HOSPITAL_COMMUNITY): Payer: Self-pay | Admitting: Certified Registered Nurse Anesthetist

## 2018-12-14 DIAGNOSIS — D696 Thrombocytopenia, unspecified: Secondary | ICD-10-CM

## 2018-12-14 DIAGNOSIS — F1020 Alcohol dependence, uncomplicated: Secondary | ICD-10-CM

## 2018-12-14 DIAGNOSIS — R7881 Bacteremia: Secondary | ICD-10-CM

## 2018-12-14 DIAGNOSIS — D509 Iron deficiency anemia, unspecified: Secondary | ICD-10-CM

## 2018-12-14 DIAGNOSIS — R768 Other specified abnormal immunological findings in serum: Secondary | ICD-10-CM

## 2018-12-14 DIAGNOSIS — D638 Anemia in other chronic diseases classified elsewhere: Secondary | ICD-10-CM

## 2018-12-14 DIAGNOSIS — D649 Anemia, unspecified: Secondary | ICD-10-CM

## 2018-12-14 DIAGNOSIS — F1990 Other psychoactive substance use, unspecified, uncomplicated: Secondary | ICD-10-CM

## 2018-12-14 HISTORY — PX: TEE WITHOUT CARDIOVERSION: SHX5443

## 2018-12-14 LAB — CULTURE, BLOOD (ROUTINE X 2)
Culture: NO GROWTH
Culture: NO GROWTH

## 2018-12-14 SURGERY — ECHOCARDIOGRAM, TRANSESOPHAGEAL
Anesthesia: Monitor Anesthesia Care

## 2018-12-14 MED ORDER — SODIUM CHLORIDE 0.9 % IV SOLN
INTRAVENOUS | Status: DC | PRN
  Administered 2018-12-14: 10:00:00 via INTRAVENOUS

## 2018-12-14 MED ORDER — PROPOFOL 500 MG/50ML IV EMUL
INTRAVENOUS | Status: DC | PRN
  Administered 2018-12-14: 125 ug/kg/min via INTRAVENOUS

## 2018-12-14 MED ORDER — ALPRAZOLAM 0.5 MG PO TABS
1.0000 mg | ORAL_TABLET | Freq: Three times a day (TID) | ORAL | Status: DC | PRN
  Administered 2018-12-14 – 2018-12-18 (×12): 1 mg via ORAL
  Filled 2018-12-14 (×12): qty 2

## 2018-12-14 MED ORDER — DICLOFENAC SODIUM 1 % TD GEL
2.0000 g | Freq: Four times a day (QID) | TRANSDERMAL | Status: DC
  Administered 2018-12-14 – 2018-12-18 (×14): 2 g via TOPICAL
  Filled 2018-12-14: qty 100

## 2018-12-14 MED ORDER — TRAMADOL HCL 50 MG PO TABS
100.0000 mg | ORAL_TABLET | Freq: Three times a day (TID) | ORAL | Status: DC | PRN
  Administered 2018-12-16 – 2018-12-18 (×6): 100 mg via ORAL
  Filled 2018-12-14 (×6): qty 2

## 2018-12-14 MED ORDER — PROPOFOL 10 MG/ML IV BOLUS
INTRAVENOUS | Status: DC | PRN
  Administered 2018-12-14: 20 mg via INTRAVENOUS

## 2018-12-14 MED ORDER — BUTAMBEN-TETRACAINE-BENZOCAINE 2-2-14 % EX AERO
INHALATION_SPRAY | CUTANEOUS | Status: DC | PRN
  Administered 2018-12-14: 10:00:00 2 via TOPICAL

## 2018-12-14 MED ORDER — DEXMEDETOMIDINE HCL 200 MCG/2ML IV SOLN
INTRAVENOUS | Status: DC | PRN
  Administered 2018-12-14: 16 ug via INTRAVENOUS
  Administered 2018-12-14: 24 ug via INTRAVENOUS

## 2018-12-14 MED ORDER — CLONAZEPAM 1 MG PO TABS: 1.0000 mg | ORAL_TABLET | Freq: Two times a day (BID) | ORAL | Status: DC

## 2018-12-14 MED ORDER — METHOCARBAMOL 500 MG PO TABS
500.0000 mg | ORAL_TABLET | Freq: Three times a day (TID) | ORAL | Status: DC | PRN
  Administered 2018-12-14 – 2018-12-15 (×3): 500 mg via ORAL
  Filled 2018-12-14 (×3): qty 1

## 2018-12-14 MED ORDER — BUSPIRONE HCL 15 MG PO TABS
7.5000 mg | ORAL_TABLET | Freq: Three times a day (TID) | ORAL | Status: DC
  Administered 2018-12-14 – 2018-12-17 (×10): 7.5 mg via ORAL
  Filled 2018-12-14 (×14): qty 1

## 2018-12-14 NOTE — CV Procedure (Signed)
    Transesophageal Echocardiogram Note  [name] [id number] [birth year]  Procedure: Transesophageal Echocardiogram Indications: bacteremia   Procedure Details Consent: Obtained Time Out: Verified patient identification, verified procedure, site/side was marked, verified correct patient position, special equipment/implants available, Radiology Safety Procedures followed,  medications/allergies/relevent history reviewed, required imaging and test results available.  Performed  Medications:  During this procedure the patient is administered a profolol drip total of 120 mg  by [name] , CRNA along with precidex 40 mg iv     Left Ventrical:  Normal LV function   Mitral Valve: normal,  No vegetations   Aortic Valve: normal 3 leaflet valve, no vegetations   Tricuspid Valve: normal, no vegetations   Pulmonic Valve: not well visualized   Left Atrium/ Left atrial appendage: no thrombi   Atrial septum: no obvious ASD or PFO by color doppler evaluation   Aorta: normal.   Complications: No apparent complications Patient did tolerate procedure well.   Thayer Headings, Maria Bonito., MD, [facility] 12/14/2018, 10:12 AM

## 2018-12-14 NOTE — Anesthesia Procedure Notes (Signed)
Procedure Name: MAC Performed by: Danell Vazquez B, CRNA Pre-anesthesia Checklist: Patient identified, Emergency Drugs available, Suction available, Patient being monitored and Timeout performed Patient Re-evaluated:Patient Re-evaluated prior to induction Oxygen Delivery Method: Nasal cannula Induction Type: IV induction Airway Equipment and Method: Bite block Placement Confirmation: positive ETCO2 Dental Injury: Teeth and Oropharynx as per pre-operative assessment        

## 2018-12-14 NOTE — Progress Notes (Addendum)
Occupational Therapy Treatment Patient Details Name: Maria BirminghamMisty Noel Chandler MRN: [id number] DOB: Apr 05, 1971 Today's Date: 12/14/2018    History of present illness [name] is a 48 y.o. female was transferred [facility] yesterday because of fever hypoxia and right upper lobe infiltrate seen on CT scan.  COVID testing there and after admission here was negative.  Admission blood cultures here are growing MSSA and 1 set.  She has a history of IVDU and hepatitis C.   OT comments  Pt progressing towards OT goals, continues to have limitations due to L shoulder pain. Pt performing functional mobility within room and standing grooming ADL without AD at overall supervision level. Pt engaged in LUE seated ROM/HEP within pain tolerance. On 3L O2 throughout session with O2 sats >90%, HR up to 123 with standing activity. Feel POC remains appropriate at this time. Will continue to follow acutely.   Follow Up Recommendations  Outpatient OT    Equipment Recommendations  None recommended by OT          Precautions / Restrictions Precautions Precautions: None Restrictions Weight Bearing Restrictions: No       Mobility Bed Mobility Overal bed mobility: Independent             General bed mobility comments: pt seated EOB upon arrival  Transfers Overall transfer level: Independent Equipment used: None Transfers: Sit to/from Stand Sit to Stand: Independent              Balance Overall balance assessment: No apparent balance deficits (not formally assessed)   Sitting balance-Leahy Scale: Normal     Standing balance support: No upper extremity supported Standing balance-Leahy Scale: Normal Standing balance comment: Pt able to stand on one foot to untangle herself from lines/tubes. No signs of instability                           ADL either performed or assessed with clinical judgement   ADL Overall ADL's : Needs  assistance/impaired Eating/Feeding: Modified independent;Sitting Eating/Feeding Details (indicate cue type and reason): pt eating lunch upon entry Grooming: Wash/dry hands;Supervision/safety;Standing Grooming Details (indicate cue type and reason): standing at sink           Upper Body Dressing Details (indicate cue type and reason): educated pt in compensatory strategies for UB dressing task (including technique for button up and overhead shirt)                  Functional mobility during ADLs: Supervision/safety                         Cognition Arousal/Alertness: Awake/alert Behavior During Therapy: WFL for tasks assessed/performed Overall Cognitive Status: Within Functional Limits for tasks assessed                                          Exercises Exercises: General Upper Extremity;Hand exercises General Exercises - Upper Extremity Elbow Flexion: AROM;Left;5 reps;Seated Elbow Extension: AROM;Left;5 reps;Seated Wrist Flexion: AROM;Left;5 reps;Seated Wrist Extension: AROM;Left;5 reps;Seated Digit Composite Flexion: AROM;Left;10 reps;Seated Composite Extension: AROM;Left;10 reps;Seated Hand Exercises Forearm Supination: AROM;Left;5 reps Forearm Pronation: AROM;Left;5 reps;Seated   Shoulder Instructions       General Comments pt on 3L O2 during session, O2 sats maintaining >90%, HR up to 123 with standing activity    Pertinent Vitals/  Pain       Pain Assessment: Faces Pain Score: 10-Worst pain ever Faces Pain Scale: Hurts whole lot Pain Location: L shoulder with LUE movement Pain Descriptors / Indicators: Discomfort;Grimacing;Guarding Pain Intervention(s): Monitored during session;Limited activity within patient's tolerance  Home Living                                          Prior Functioning/Environment              Frequency  Min 2X/week        Progress Toward Goals  OT Goals(current goals can  now be found in the care plan section)  Progress towards OT goals: Progressing toward goals  Acute Rehab OT Goals Patient Stated Goal: home by Sunday OT Goal Formulation: With patient Time For Goal Achievement: 12/25/18 Potential to Achieve Goals: Good ADL Goals Pt Will Perform Toileting - Clothing Manipulation and hygiene: with modified independence;sit to/from stand Pt/caregiver will Perform Home Exercise Program: Increased strength;Left upper extremity;With written HEP provided;Independently Additional ADL Goal #1: Pt will improve to independent with OOB ADL in standing with fair balance.  Plan Discharge plan remains appropriate    Co-evaluation                 AM-PAC OT "6 Clicks" Daily Activity     Outcome Measure   Help from another person eating meals?: None Help from another person taking care of personal grooming?: None Help from another person toileting, which includes using toliet, bedpan, or urinal?: None Help from another person bathing (including washing, rinsing, drying)?: A Little Help from another person to put on and taking off regular upper body clothing?: A Little Help from another person to put on and taking off regular lower body clothing?: A Little 6 Click Score: 21    End of Session Equipment Utilized During Treatment: Oxygen  OT Visit Diagnosis: Unsteadiness on feet (R26.81);Muscle weakness (generalized) (M62.81);Pain Pain - Right/Left: Left Pain - part of body: Shoulder   Activity Tolerance Patient tolerated treatment well   Patient Left with call bell/phone within reach;Other (comment)(seated EOB)   Nurse Communication Mobility status        Time: 8921-1941 OT Time Calculation (min): 15 min  Charges: OT General Charges $OT Visit: 1 Visit OT Treatments $Self Care/Home Management : 8-22 mins  Lou Cal, OT Supplemental Rehabilitation Services Pager [phone] Office [phone]    Raymondo Band 12/14/2018, 1:12  PM

## 2018-12-14 NOTE — Anesthesia Preprocedure Evaluation (Signed)
Anesthesia Evaluation  Patient identified by MRN, date of birth, ID band Patient awake    Reviewed: Allergy & Precautions, H&P , NPO status , Patient's Chart, lab work & pertinent test results, reviewed documented beta blocker date and time   Airway Mallampati: II  TM Distance: >3 FB Neck ROM: Full    Dental no notable dental hx. (+) Edentulous Upper, Dental Advisory Given   Pulmonary COPD, Current Smoker,    Pulmonary exam normal breath sounds clear to auscultation       Cardiovascular Exercise Tolerance: Good negative cardio ROS   Rhythm:Regular Rate:Normal     Neuro/Psych negative neurological ROS  negative psych ROS   GI/Hepatic negative GI ROS, Neg liver ROS,   Endo/Other  negative endocrine ROS  Renal/GU negative Renal ROS  negative genitourinary   Musculoskeletal   Abdominal   Peds  Hematology  (+) Blood dyscrasia, anemia ,   Anesthesia Other Findings   Reproductive/Obstetrics negative OB ROS                            Anesthesia Physical Anesthesia Plan  ASA: III  Anesthesia Plan: MAC   Post-op Pain Management:    Induction: Intravenous  PONV Risk Score and Plan: 1 and Propofol infusion  Airway Management Planned: Nasal Cannula  Additional Equipment:   Intra-op Plan:   Post-operative Plan:   Informed Consent: I have reviewed the patients History and Physical, chart, labs and discussed the procedure including the risks, benefits and alternatives for the proposed anesthesia with the patient or authorized representative who has indicated his/her understanding and acceptance.     Dental advisory given  Plan Discussed with: CRNA  Anesthesia Plan Comments:         Anesthesia Quick Evaluation  

## 2018-12-14 NOTE — Transfer of Care (Signed)
Immediate Anesthesia Transfer of Care Note  Patient: [name]  Procedure(s) Performed: TRANSESOPHAGEAL ECHOCARDIOGRAM (TEE) (N/A )  Patient Location: Endoscopy Unit  Anesthesia Type:MAC  Level of Consciousness: drowsy  Airway & Oxygen Therapy: Patient Spontanous Breathing and Patient connected to nasal cannula oxygen  Post-op Assessment: Report given to RN and Post -op Vital signs reviewed and stable  Post vital signs: Reviewed and stable  Last Vitals:  Vitals Value Taken Time  BP 99/56 12/14/18 1015  Temp    Pulse 84 12/14/18 1015  Resp 18 12/14/18 1015  SpO2 93 % 12/14/18 1015  Vitals shown include unvalidated device data.  Last Pain:  Vitals:   12/14/18 0929  TempSrc: Oral  PainSc: 10-Worst pain ever      Patients Stated Pain Goal: 3 (59/16/38 4665)  Complications: No apparent anesthesia complications

## 2018-12-14 NOTE — Progress Notes (Signed)
PROGRESS NOTE  [name] ION:[id number] DOB: Mar 08, 1971   PCP: No primary care provider on file.  Patient is from: Home  DOA: 12/08/2018 LOS: 6  Brief Narrative / Interim history: 48 year old female with history of IVDU and emphysema who presented to [facility] with acute respiratory failure and hypoxemia and found to have bacteremia.  Subjective: Complaining and crying about left shoulder pain.  Pain did not improve with Lidoderm patch.  She denies chest pain, dyspnea, GI or GU symptoms.  Pain is worse with movement.  Also reports limited range of motion in left shoulder.  Denies numbness or tingling.  Objective: Vitals:   12/14/18 1023 12/14/18 1034 12/14/18 1202 12/14/18 1600  BP: (!) 98/53 104/67 (!) 127/96   Pulse: 85 88    Resp: 17 19    Temp:   98.8 F (37.1 C)   TempSrc:   Oral   SpO2: 93% 92% 96% 92%  Weight:      Height:        Intake/Output Summary (Last 24 hours) at 12/14/2018 1713 Last data filed at 12/14/2018 1100 Gross per 24 hour  Intake 440 ml  Output 800 ml  Net -360 ml   Filed Weights   12/08/18 2000 12/08/18 2030 12/13/18 0736  Weight: 61.7 kg 61 kg 61 kg    Examination:  GENERAL: Tearful due to left shoulder pain HEENT: MMM.  Vision and hearing grossly intact.  NECK: Supple.  No apparent JVD.  LUNGS:  No IWOB. Good air movement bilaterally. HEART:  RRR. Heart sounds normal.  ABD: Bowel sounds present. Soft. Non tender.  MSK/EXT:  Moves extremities. No apparent deformity or edema.  Tenderness to palpation around the left shoulder mainly anteriorly.  No overlying skin color change.  Limited range of motion at left shoulder partly due to pain. SKIN: no apparent skin lesion or wound NEURO: Awake, alert and oriented appropriately.  No gross deficit.  PSYCH: Calm.  Tearful due to left shoulder pain  I have personally reviewed the following labs and images:  Radiology Studies: No results found.  Microbiology: Recent Results (from the past 240  hour(s))  Culture, blood (routine x 2) Call MD if unable to obtain prior to antibiotics being given     Status: Abnormal   Collection Time: 12/08/18 10:20 PM   Specimen: BLOOD LEFT HAND  Result Value Ref Range Status   Specimen Description BLOOD LEFT HAND  Final   Special Requests   Final    BOTTLES DRAWN AEROBIC ONLY Blood Culture results may not be optimal due to an inadequate volume of blood received in culture bottles   Culture  Setup Time   Final    GRAM POSITIVE COCCI IN CLUSTERS AEROBIC BOTTLE ONLY CRITICAL RESULT CALLED TO, READ BACK BY AND VERIFIED WITH: Hessie Diener. Dang PharmD 16:50 12/09/18 ([email or web address]) Performed at [facility], [address] [place], [place] [address]    Culture STAPHYLOCOCCUS AUREUS (A)  Final   Report Status 12/11/2018 FINAL  Final   Organism ID, Bacteria STAPHYLOCOCCUS AUREUS  Final      Susceptibility   Staphylococcus aureus - MIC*    CIPROFLOXACIN <=0.5 SENSITIVE Sensitive     ERYTHROMYCIN >=8 RESISTANT Resistant     GENTAMICIN <=0.5 SENSITIVE Sensitive     OXACILLIN <=0.25 SENSITIVE Sensitive     TETRACYCLINE <=1 SENSITIVE Sensitive     VANCOMYCIN 1 SENSITIVE Sensitive     TRIMETH/SULFA <=10 SENSITIVE Sensitive     CLINDAMYCIN <=0.25 SENSITIVE Sensitive  RIFAMPIN <=0.5 SENSITIVE Sensitive     Inducible Clindamycin NEGATIVE Sensitive     * STAPHYLOCOCCUS AUREUS  Culture, blood (routine x 2) Call MD if unable to obtain prior to antibiotics being given     Status: None   Collection Time: 12/08/18 10:20 PM   Specimen: BLOOD RIGHT ARM  Result Value Ref Range Status   Specimen Description BLOOD RIGHT ARM  Final   Special Requests   Final    BOTTLES DRAWN AEROBIC ONLY Blood Culture results may not be optimal due to an inadequate volume of blood received in culture bottles   Culture   Final    NO GROWTH 5 DAYS Performed at [facility], [place] [address] [place], [place] [address]    Report Status 12/13/2018 FINAL  Final  Blood Culture ID Panel  (Reflexed)     Status: Abnormal   Collection Time: 12/08/18 10:20 PM  Result Value Ref Range Status   Enterococcus species NOT DETECTED NOT DETECTED Final   Listeria monocytogenes NOT DETECTED NOT DETECTED Final   Staphylococcus species DETECTED (A) NOT DETECTED Final    Comment: CRITICAL RESULT CALLED TO, READ BACK BY AND VERIFIED WITH: Diona Browner PharmD 15:50 12/09/18 ([email or web address])    Staphylococcus aureus (BCID) DETECTED (A) NOT DETECTED Final    Comment: Methicillin (oxacillin) susceptible Staphylococcus aureus (MSSA). Preferred therapy is anti staphylococcal beta lactam antibiotic (Cefazolin or Nafcillin), unless clinically contraindicated. CRITICAL RESULT CALLED TO, READ BACK BY AND VERIFIED WITH: Diona Browner PharmD 15:50 12/09/18 ([email or web address])    Methicillin resistance NOT DETECTED NOT DETECTED Final   Streptococcus species NOT DETECTED NOT DETECTED Final   Streptococcus agalactiae NOT DETECTED NOT DETECTED Final   Streptococcus pneumoniae NOT DETECTED NOT DETECTED Final   Streptococcus pyogenes NOT DETECTED NOT DETECTED Final   Acinetobacter baumannii NOT DETECTED NOT DETECTED Final   Enterobacteriaceae species NOT DETECTED NOT DETECTED Final   Enterobacter cloacae complex NOT DETECTED NOT DETECTED Final   Escherichia coli NOT DETECTED NOT DETECTED Final   Klebsiella oxytoca NOT DETECTED NOT DETECTED Final   Klebsiella pneumoniae NOT DETECTED NOT DETECTED Final   Proteus species NOT DETECTED NOT DETECTED Final   Serratia marcescens NOT DETECTED NOT DETECTED Final   Haemophilus influenzae NOT DETECTED NOT DETECTED Final   Neisseria meningitidis NOT DETECTED NOT DETECTED Final   Pseudomonas aeruginosa NOT DETECTED NOT DETECTED Final   Candida albicans NOT DETECTED NOT DETECTED Final   Candida glabrata NOT DETECTED NOT DETECTED Final   Candida krusei NOT DETECTED NOT DETECTED Final   Candida parapsilosis NOT DETECTED NOT DETECTED Final   Candida tropicalis NOT DETECTED NOT DETECTED Final     Comment: Performed at [facility], [place] [address] [place], [place] [address]  SARS Coronavirus 2 (CEPHEID- Performed in [place] hospital lab), Hosp Order     Status: None   Collection Time: 12/08/18 11:45 PM   Specimen: Nasopharyngeal Swab  Result Value Ref Range Status   SARS Coronavirus 2 NEGATIVE NEGATIVE Final    Comment: (NOTE) If result is NEGATIVE SARS-CoV-2 target nucleic acids are NOT DETECTED. The SARS-CoV-2 RNA is generally detectable in upper and lower  respiratory specimens during the acute phase of infection. The lowest  concentration of SARS-CoV-2 viral copies this assay can detect is 250  copies / mL. A negative result does not preclude SARS-CoV-2 infection  and should not be used as the sole basis for treatment or other  patient management decisions.  A negative result may occur  with  improper specimen collection / handling, submission of specimen other  than nasopharyngeal swab, presence of viral mutation(s) within the  areas targeted by this assay, and inadequate number of viral copies  (<250 copies / mL). A negative result must be combined with clinical  observations, patient history, and epidemiological information. If result is POSITIVE SARS-CoV-2 target nucleic acids are DETECTED. The SARS-CoV-2 RNA is generally detectable in upper and lower  respiratory specimens dur ing the acute phase of infection.  Positive  results are indicative of active infection with SARS-CoV-2.  Clinical  correlation with patient history and other diagnostic information is  necessary to determine patient infection status.  Positive results do  not rule out bacterial infection or co-infection with other viruses. If result is PRESUMPTIVE POSTIVE SARS-CoV-2 nucleic acids MAY BE PRESENT.   A presumptive positive result was obtained on the submitted specimen  and confirmed on repeat testing.  While 2019 novel coronavirus  (SARS-CoV-2) nucleic acids may be present in the  submitted sample  additional confirmatory testing may be necessary for epidemiological  and / or clinical management purposes  to differentiate between  SARS-CoV-2 and other Sarbecovirus currently known to infect humans.  If clinically indicated additional testing with an alternate test  methodology [phone]([id number]) is advised. The SARS-CoV-2 RNA is generally  detectable in upper and lower respiratory sp ecimens during the acute  phase of infection. The expected result is Negative. Fact Sheet for Patients:  BoilerBrush.com.cyhttps://www.fda.gov/media/136312/download Fact Sheet for Healthcare Providers: https://pope.com/https://www.fda.gov/media/136313/download This test is not yet approved or cleared by the [place] FDA and has been authorized for detection and/or diagnosis of SARS-CoV-2 by FDA under an Emergency Use Authorization (EUA).  This EUA will remain in effect (meaning this test can be used) for the duration of the COVID-19 declaration under Section 564(b)(1) of the Act, 21 U.S.C. section 360bbb-3(b)(1), unless the authorization is terminated or revoked sooner. Performed at [facility], [address] [place], [place] [address]   MRSA PCR Screening     Status: None   Collection Time: 12/09/18 12:10 AM   Specimen: Nasopharyngeal  Result Value Ref Range Status   MRSA by PCR NEGATIVE NEGATIVE Final    Comment:        The GeneXpert MRSA Assay (FDA approved for NASAL specimens only), is one component of a comprehensive MRSA colonization surveillance program. It is not intended to diagnose MRSA infection nor to guide or monitor treatment for MRSA infections. Performed at [facility], [address] [place], [place] [address]   Culture, blood (routine x 2)     Status: None   Collection Time: 12/09/18  9:47 PM   Specimen: BLOOD  Result Value Ref Range Status   Specimen Description BLOOD LEFT HAND  Final   Special Requests   Final    BOTTLES DRAWN AEROBIC ONLY Blood Culture results may not  be optimal due to an inadequate volume of blood received in culture bottles   Culture   Final    NO GROWTH 5 DAYS Performed at [facility], [address] [place], [place] [address]    Report Status 12/14/2018 FINAL  Final  Culture, blood (routine x 2)     Status: None   Collection Time: 12/09/18  9:55 PM   Specimen: BLOOD  Result Value Ref Range Status   Specimen Description BLOOD RIGHT HAND  Final   Special Requests   Final    BOTTLES DRAWN AEROBIC AND ANAEROBIC Blood Culture  results may not be optimal due to an inadequate volume of blood received in culture bottles   Culture   Final    NO GROWTH 5 DAYS Performed at [facility], [address], [place], [place] [address]    Report Status 12/14/2018 FINAL  Final    Sepsis Labs: Invalid input(s): PROCALCITONIN, LACTICIDVEN  Urine analysis:    Component Value Date/Time   COLORURINE YELLOW 05/10/2010 1929   APPEARANCEUR TURBID (A) 05/10/2010 1929   LABSPEC 1.020 05/10/2010 1929   PHURINE 7.0 05/10/2010 1929   GLUCOSEU NEGATIVE 05/10/2010 1929   HGBUR TRACE (A) 05/10/2010 1929   BILIRUBINUR NEGATIVE 05/10/2010 1929   KETONESUR NEGATIVE 05/10/2010 1929   PROTEINUR 30 (A) 05/10/2010 1929   UROBILINOGEN 1.0 05/10/2010 1929   NITRITE POSITIVE (A) 05/10/2010 1929   LEUKOCYTESUR LARGE (A) 05/10/2010 1929    Anemia Panel: No results for input(s): VITAMINB12, FOLATE, FERRITIN, TIBC, IRON, RETICCTPCT in the last 72 hours.  Thyroid Function Tests: No results for input(s): TSH, T4TOTAL, FREET4, T3FREE, THYROIDAB in the last 72 hours.  Lipid Profile: No results for input(s): CHOL, HDL, LDLCALC, TRIG, CHOLHDL, LDLDIRECT in the last 72 hours.  CBG: Recent Labs  Lab 12/08/18 2235  GLUCAP 110*    HbA1C: No results for input(s): HGBA1C in the last 72 hours.  BNP (last 3 results): No results for input(s): PROBNP in the last 8760 hours.  Cardiac Enzymes: No results for input(s): CKTOTAL, CKMB, CKMBINDEX,  TROPONINI in the last 168 hours.  Coagulation Profile: No results for input(s): INR, PROTIME in the last 168 hours.  Liver Function Tests: Recent Labs  Lab 12/08/18 2223 12/09/18 0946  AST 21 30  ALT 18 22  ALKPHOS 46 48  BILITOT 1.0 0.5  PROT 7.0 6.2*  ALBUMIN 3.2* 2.7*   No results for input(s): LIPASE, AMYLASE in the last 168 hours. No results for input(s): AMMONIA in the last 168 hours.  Basic Metabolic Panel: Recent Labs  Lab 12/08/18 2223 12/09/18 0946 12/11/18 0718 12/13/18 0612  NA 130* 137 137 138  K 3.2* 4.3 4.2 3.7  CL 93* 102 102 100  CO2 GLUCOSE 122* 158* 130* 95  BUN <5*  CREATININE 0.78 0.50 0.53 0.63  CALCIUM 8.1* 8.1* 7.8* 8.4*  MG  --  2.3  --   --    GFR: Estimated Creatinine Clearance: 79.9 mL/min (by C-G formula based on SCr of 0.63 mg/dL).  CBC: Recent Labs  Lab 12/08/18 2223 12/09/18 0946 12/11/18 0718  WBC 4.3 3.4* 4.7  NEUTROABS 3.5  --   --   HGB 11.1* 10.2* 10.4*  HCT 32.8* 30.0* 32.2*  MCV 89.1 88.0 91.7  PLT 53* 45* 85*    Procedures:  TEE on 7/8 without vegetation  Microbiology summarized: Blood culture MSSA  Assessment & Plan: MSSA bacteremia: TTE and TEE without vegetation. -Continue Ancef for a total of 2 weeks, then oral antibiotics based on sensitivity -Consider long-acting oritavancin IV on discharge should she leave prior to recommended 14 days IV course -Appreciate IDs help  Acute respiratory failure with hypoxemia Community-acquired pneumonia -CTA chest negative for PE -Antibiotic as above -Continue bronchodilators as needed  Left shoulder pain/chronic pain syndrome -MRI left shoulder on 7/5 with moderate subacromial and subdeltoid bursitis but no other acute finding. -Start Voltaren gel -Continue gabapentin 800 mg 4 times daily -PRN oxycodone and Robaxin  Mixed iron deficiency/anemia of chronic disease: Iron saturation 8% -Hemoglobin stable -Continue  iron supplementation   Thrombocytopenia: Due to hepatitis C and alcohol use -Improving -Continue trending  Alcohol use disorder -Encourage quitting -Continue CIWA protocol -Multivitamin  Substance use disorder: UDS positive for cocaine -Cessation counseling  History of hep C: Quantitative hep C RNA negative suggesting previous exposure and clearance.  HIV negative  Anxiety: On Xanax 1 mg 3 times daily at home -Change Xanax to Klonopin-less withdrawal symptoms -Add BuSpar  DVT prophylaxis: SCD due to thrombocytopenia Code Status: Full code Family Communication: Patient and RN.  Available if question. Disposition Plan: Remains inpatient for MSSA bacteremia on IV antibiotics. Consultants: ID, cardiology  Antimicrobials: Anti-infectives (From admission, onward)   Start     Dose/Rate Route Frequency Ordered Stop   12/09/18 2000  ceFAZolin (ANCEF) IVPB 2g/100 mL premix     2 g 200 mL/hr over 30 Minutes Intravenous Every 8 hours 12/09/18 1634     12/08/18 2100  cefTRIAXone (ROCEPHIN) 2 g in sodium chloride 0.9 % 100 mL IVPB  Status:  Discontinued     2 g 200 mL/hr over 30 Minutes Intravenous Every 24 hours 12/08/18 2059 12/09/18 1627   12/08/18 2100  azithromycin (ZITHROMAX) 500 mg in sodium chloride 0.9 % 250 mL IVPB  Status:  Discontinued     500 mg 250 mL/hr over 60 Minutes Intravenous Every 24 hours 12/08/18 2059 12/09/18 1627      Sch Meds:  Scheduled Meds: . diclofenac sodium  2 g Topical QID  . ferrous sulfate  325 mg Oral BID WC  . folic acid  1 mg Oral Daily  . gabapentin  800 mg Oral QID  . lidocaine  1 patch Transdermal Q24H  . multivitamin with minerals  1 tablet Oral Daily  . senna-docusate  2 tablet Oral BID  . sodium chloride flush  10-40 mL Intracatheter Q12H  . thiamine  100 mg Oral Daily   Continuous Infusions: .  ceFAZolin (ANCEF) IV 2 g (12/14/18 1224)   PRN Meds:.ALPRAZolam, guaiFENesin, LORazepam **OR** LORazepam, methocarbamol, oxyCODONE, sodium chloride flush,  traMADol   Tanai Bouler T. Sherrell Weir [facility]  If 7PM-7AM, please contact night-coverage [email or web address] Password [email or web address] 12/14/2018, 5:13 PM

## 2018-12-14 NOTE — Progress Notes (Signed)
Physical Therapy Treatment Patient Details Name: Maria BirminghamMisty Noel Parkey MRN: [id number] DOB: 1971/02/14 Today's Date: 12/14/2018    History of Present Illness [name] is a 48 y.o. female was transferred up from [facility] yesterday because of fever hypoxia and right upper lobe infiltrate seen on CT scan.  COVID testing there and after admission here was negative.  Admission blood cultures here are growing MSSA and 1 set.  She has a history of IVDU and hepatitis C.    PT Comments    Patient doing well with therapy. C/o of L shoulder pain and hesitant initially for mobility, eventually agreeable and happy to get out of bed. Ambulating unit and stairs with supervision and no AD. desats on RA at this time, remains above 88% on 3L's with activity.  Agree with prior recs for not PT follow up after d/c. Will cont to follow to safely progress.     Follow Up Recommendations  No PT follow up     Equipment Recommendations  None recommended by PT    Recommendations for Other Services       Precautions / Restrictions Precautions Precautions: None Restrictions Weight Bearing Restrictions: No    Mobility  Bed Mobility Overal bed mobility: Independent                Transfers Overall transfer level: Independent Equipment used: None Transfers: Sit to/from Stand Sit to Stand: Independent            Ambulation/Gait Ambulation/Gait assistance: Modified independent (Device/Increase time) Gait Distance (Feet): 260 Feet Assistive device: Rolling walker (2 wheeled);None Gait Pattern/deviations: Step-through pattern Gait velocity: decreased   General Gait Details: Ambulating without AD today, desats on RA quickly to 70s. maintains 88+ on 3L with intermittent pursed lip breathing needed.    Stairs Stairs: Yes Stairs assistance: Min guard Stair Management: One rail Right;One rail Left Number of Stairs: 6 General stair comments: step to step pattern, 1UE  support no rail. good stabiltiy, on 4L no desat   Wheelchair Mobility    Modified Rankin (Stroke Patients Only)       Balance Overall balance assessment: No apparent balance deficits (not formally assessed)   Sitting balance-Leahy Scale: Normal     Standing balance support: No upper extremity supported Standing balance-Leahy Scale: Normal Standing balance comment: Pt able to stand on one foot to untangle herself from lines/tubes. No signs of instability                            Cognition Arousal/Alertness: Awake/alert Behavior During Therapy: WFL for tasks assessed/performed Overall Cognitive Status: Within Functional Limits for tasks assessed                                        Exercises      General Comments        Pertinent Vitals/Pain Pain Assessment: 0-10 Pain Score: 10-Worst pain ever Pain Intervention(s): Limited activity within patient's tolerance;Monitored during session    Home Living                      Prior Function            PT Goals (current goals can now be found in the care plan section) Acute Rehab PT Goals Patient Stated Goal: to go home PT Goal Formulation: With patient Time  For Goal Achievement: 12/24/18 Progress towards PT goals: Progressing toward goals    Frequency    Min 3X/week      PT Plan Current plan remains appropriate    Co-evaluation              AM-PAC PT "6 Clicks" Mobility   Outcome Measure  Help needed turning from your back to your side while in a flat bed without using bedrails?: None Help needed moving from lying on your back to sitting on the side of a flat bed without using bedrails?: None Help needed moving to and from a bed to a chair (including a wheelchair)?: None Help needed standing up from a chair using your arms (e.g., wheelchair or bedside chair)?: None Help needed to walk in hospital room?: None Help needed climbing 3-5 steps with a railing? : A  Little 6 Click Score: 23    End of Session   Activity Tolerance: Patient tolerated treatment well Patient left: in bed;with call bell/phone within reach   PT Visit Diagnosis: Other abnormalities of gait and mobility (R26.89)     Time: 4656-8127 PT Time Calculation (min) (ACUTE ONLY): 23 min  Charges:  $Gait Training: 23-37 mins                     Reinaldo Berber, PT, DPT Acute Rehabilitation Services Pager: [phone] Office: [phone]     Reinaldo Berber 12/14/2018, 12:37 PM

## 2018-12-14 NOTE — Interval H&P Note (Signed)
History and Physical Interval Note:  12/14/2018 9:11 AM  [name]  has presented today for surgery, with the diagnosis of bacteremia.  The various methods of treatment have been discussed with the patient and family. After consideration of risks, benefits and other options for treatment, the patient has consented to  Procedure(s): TRANSESOPHAGEAL ECHOCARDIOGRAM (TEE) (N/A) as a surgical intervention.  The patient's history has been reviewed, patient examined, no change in status, stable for surgery.  I have reviewed the patient's chart and labs.  Questions were answered to the patient's satisfaction.     Mertie Moores

## 2018-12-14 NOTE — Progress Notes (Signed)
Patient ID: Maria BirminghamMisty Noel Loewen, female   DOB: 1970-12-28, 48 y.o.   MRN: [id number]         [facility]  Date of Admission:  12/08/2018           Day 6 cefazolin   ASSESSMENT: She has pneumonia complicated by MSSA bacteremia.  Both blood cultures done at [facility] are positive and 1 of 2 admission blood cultures here is positive.  Repeat blood cultures are negative.  There is no evidence of endocarditis by TTE or TEE. MRI of her left shoulder did not reveal any evidence of infection, although she is quite tender and unable to raise her arm today. There is no bruising, swelling or redness.  Her left anterior chest pain is also very tender on exam but without any swelling/warmth.   This could be due to costochondritis secondary to coughing vs early septic arthritis. Would like to give her 14 days of IV therapy however she is likely only able to stay through Sunday due to the need to get home to care for her 46 year old daughter. Would consider giving long acting Oritavancin IV on discharge should she leave prior to recommended 14 day course.   Her hepatitis C antibody test was positive; fortunately her Hep C RNA is negative which indicates she has spontaneously cleared infection previously. No treatment needed. HIV testing negative.    PLAN: 1. Cefazolin to continue via peripheral IV.    Principal Problem:   Bacteremia due to methicillin susceptible Staphylococcus aureus (MSSA) Active Problems:   Acute on chronic respiratory failure with hypoxia (HCC)   Pneumonia   Hyponatremia   Hypokalemia   COPD (chronic obstructive pulmonary disease) (HCC)   Left shoulder pain   Normocytic anemia   Thrombocytopenia (HCC)   IVDU (intravenous drug user)   History of hepatitis C   Scheduled Meds: . diclofenac sodium  2 g Topical QID  . ferrous sulfate  325 mg Oral BID WC  . folic acid  1 mg Oral Daily  . gabapentin  800 mg Oral QID  . lidocaine  1 patch  Transdermal Q24H  . multivitamin with minerals  1 tablet Oral Daily  . senna-docusate  2 tablet Oral BID  . sodium chloride flush  10-40 mL Intracatheter Q12H  . thiamine  100 mg Oral Daily   Continuous Infusions: .  ceFAZolin (ANCEF) IV 2 g (12/14/18 0302)   PRN Meds:.ALPRAZolam, guaiFENesin, LORazepam **OR** LORazepam, methocarbamol, oxyCODONE, sodium chloride flush, traMADol   SUBJECTIVE: Her cough is somewhat improved but still unable to raise left arm and describes "severe debilitating pain" here. Chest pain is still quite tender with palpation. Very happy that her TEE was negative.   Review of Systems: Review of Systems  Constitutional: Negative for chills, diaphoresis and fever.  Respiratory: Positive for cough and wheezing. Negative for sputum production and shortness of breath.   Cardiovascular: Negative for chest pain.  Gastrointestinal: Negative for abdominal pain, diarrhea, nausea and vomiting.  Genitourinary: Negative for dysuria.  Musculoskeletal: Positive for joint pain.  Neurological: Negative for headaches.  Psychiatric/Behavioral: Negative for depression. The patient is nervous/anxious.        Tearful     Allergies  Allergen Reactions  . Morphine And Related Hives  . Zofran [Ondansetron Hcl] Other (See Comments)    Pt said it made her really sick    OBJECTIVE: Vitals:   12/14/18 0929 12/14/18 1015 12/14/18 1023 12/14/18 1034  BP: 121/82 (!) 99/56 [name](!)  98/53 104/67  Pulse:  89 85 88  Resp: 13 18 17 19   Temp: 98.3 F (36.8 C) 98.7 F (37.1 C)    TempSrc: Oral Axillary    SpO2: 92% 93% 93% 92%  Weight:      Height:       Body mass index is 22.38 kg/m.  Physical Exam Constitutional:      Comments: She is sitting up in bed.  She is pleasant and talkative.  She is not as anxious.  HENT:     Mouth/Throat:     Comments: Many broken or missing teeth. Eyes:     Conjunctiva/sclera: Conjunctivae normal.  Cardiovascular:     Rate and Rhythm: Normal  rate and regular rhythm.     Heart sounds: No murmur.     Comments: Distant heart sounds. Pulmonary:     Effort: Pulmonary effort is normal.     Breath sounds: Wheezing and rales present.  Chest:    Abdominal:     Palpations: Abdomen is soft.     Tenderness: There is no abdominal tenderness.  Musculoskeletal:        General: Tenderness present. No swelling.     Comments: There is tenderness with palpation of her anterior left shoulder.  There is no unusual warmth, redness or swelling.  Skin:    Findings: No rash.  Neurological:     Mental Status: She is oriented to person, place, and time.  Psychiatric:     Comments: Tearful      Lab Results Lab Results  Component Value Date   WBC 4.7 12/11/2018   HGB 10.4 (L) 12/11/2018   HCT 32.2 (L) 12/11/2018   MCV 91.7 12/11/2018   PLT 85 (L) 12/11/2018    Lab Results  Component Value Date   CREATININE 0.63 12/13/2018   BUN <5 (L) 12/13/2018   NA 138 12/13/2018   K 3.7 12/13/2018   CL 100 12/13/2018   CO2 28 12/13/2018    Lab Results  Component Value Date   ALT 22 12/09/2018   AST 30 12/09/2018   ALKPHOS 48 12/09/2018   BILITOT 0.5 12/09/2018     Microbiology: Recent Results (from the past 240 hour(s))  Culture, blood (routine x 2) Call MD if unable to obtain prior to antibiotics being given     Status: Abnormal   Collection Time: 12/08/18 10:20 PM   Specimen: BLOOD LEFT HAND  Result Value Ref Range Status   Specimen Description BLOOD LEFT HAND  Final   Special Requests   Final    BOTTLES DRAWN AEROBIC ONLY Blood Culture results may not be optimal due to an inadequate volume of blood received in culture bottles   Culture  Setup Time   Final    GRAM POSITIVE COCCI IN CLUSTERS AEROBIC BOTTLE ONLY CRITICAL RESULT CALLED TO, READ BACK BY AND VERIFIED WITH: Hessie Diener. Dang PharmD 16:50 12/09/18 ([email or web address]) Performed at [facility], [address] [place], [place] [address]    Culture STAPHYLOCOCCUS AUREUS (A)  Final    Report Status 12/11/2018 FINAL  Final   Organism ID, Bacteria STAPHYLOCOCCUS AUREUS  Final      Susceptibility   Staphylococcus aureus - MIC*    CIPROFLOXACIN <=0.5 SENSITIVE Sensitive     ERYTHROMYCIN >=8 RESISTANT Resistant     GENTAMICIN <=0.5 SENSITIVE Sensitive     OXACILLIN <=0.25 SENSITIVE Sensitive     TETRACYCLINE <=1 SENSITIVE Sensitive     VANCOMYCIN 1 SENSITIVE Sensitive  TRIMETH/SULFA <=10 SENSITIVE Sensitive     CLINDAMYCIN <=0.25 SENSITIVE Sensitive     RIFAMPIN <=0.5 SENSITIVE Sensitive     Inducible Clindamycin NEGATIVE Sensitive     * STAPHYLOCOCCUS AUREUS  Culture, blood (routine x 2) Call MD if unable to obtain prior to antibiotics being given     Status: None   Collection Time: 12/08/18 10:20 PM   Specimen: BLOOD RIGHT ARM  Result Value Ref Range Status   Specimen Description BLOOD RIGHT ARM  Final   Special Requests   Final    BOTTLES DRAWN AEROBIC ONLY Blood Culture results may not be optimal due to an inadequate volume of blood received in culture bottles   Culture   Final    NO GROWTH 5 DAYS Performed at [facility], [address] [place], [place] [address]    Report Status 12/13/2018 FINAL  Final  Blood Culture ID Panel (Reflexed)     Status: Abnormal   Collection Time: 12/08/18 10:20 PM  Result Value Ref Range Status   Enterococcus species NOT DETECTED NOT DETECTED Final   Listeria monocytogenes NOT DETECTED NOT DETECTED Final   Staphylococcus species DETECTED (A) NOT DETECTED Final    Comment: CRITICAL RESULT CALLED TO, READ BACK BY AND VERIFIED WITH: Hessie Diener. Dang PharmD 15:50 12/09/18 ([email or web address])    Staphylococcus aureus (BCID) DETECTED (A) NOT DETECTED Final    Comment: Methicillin (oxacillin) susceptible Staphylococcus aureus (MSSA). Preferred therapy is anti staphylococcal beta lactam antibiotic (Cefazolin or Nafcillin), unless clinically contraindicated. CRITICAL RESULT CALLED TO, READ BACK BY AND VERIFIED WITH: Hessie Diener. Dang PharmD 15:50 12/09/18  ([email or web address])    Methicillin resistance NOT DETECTED NOT DETECTED Final   Streptococcus species NOT DETECTED NOT DETECTED Final   Streptococcus agalactiae NOT DETECTED NOT DETECTED Final   Streptococcus pneumoniae NOT DETECTED NOT DETECTED Final   Streptococcus pyogenes NOT DETECTED NOT DETECTED Final   Acinetobacter baumannii NOT DETECTED NOT DETECTED Final   Enterobacteriaceae species NOT DETECTED NOT DETECTED Final   Enterobacter cloacae complex NOT DETECTED NOT DETECTED Final   Escherichia coli NOT DETECTED NOT DETECTED Final   Klebsiella oxytoca NOT DETECTED NOT DETECTED Final   Klebsiella pneumoniae NOT DETECTED NOT DETECTED Final   Proteus species NOT DETECTED NOT DETECTED Final   Serratia marcescens NOT DETECTED NOT DETECTED Final   Haemophilus influenzae NOT DETECTED NOT DETECTED Final   Neisseria meningitidis NOT DETECTED NOT DETECTED Final   Pseudomonas aeruginosa NOT DETECTED NOT DETECTED Final   Candida albicans NOT DETECTED NOT DETECTED Final   Candida glabrata NOT DETECTED NOT DETECTED Final   Candida krusei NOT DETECTED NOT DETECTED Final   Candida parapsilosis NOT DETECTED NOT DETECTED Final   Candida tropicalis NOT DETECTED NOT DETECTED Final    Comment: Performed at [facility], [address] [place], [place] [address]  SARS Coronavirus 2 (CEPHEID- Performed in [facility]), Hosp Order     Status: None   Collection Time: 12/08/18 11:45 PM   Specimen: Nasopharyngeal Swab  Result Value Ref Range Status   SARS Coronavirus 2 NEGATIVE NEGATIVE Final    Comment: (NOTE) If result is NEGATIVE SARS-CoV-2 target nucleic acids are NOT DETECTED. The SARS-CoV-2 RNA is generally detectable in upper and lower  respiratory specimens during the acute phase of infection. The lowest  concentration of SARS-CoV-2 viral copies this assay can detect is 250  copies / mL. A negative result does not preclude SARS-CoV-2 infection  and should not be used as the sole  basis  for treatment or other  patient management decisions.  A negative result may occur with  improper specimen collection / handling, submission of specimen other  than nasopharyngeal swab, presence of viral mutation(s) within the  areas targeted by this assay, and inadequate number of viral copies  (<250 copies / mL). A negative result must be combined with clinical  observations, patient history, and epidemiological information. If result is POSITIVE SARS-CoV-2 target nucleic acids are DETECTED. The SARS-CoV-2 RNA is generally detectable in upper and lower  respiratory specimens dur ing the acute phase of infection.  Positive  results are indicative of active infection with SARS-CoV-2.  Clinical  correlation with patient history and other diagnostic information is  necessary to determine patient infection status.  Positive results do  not rule out bacterial infection or co-infection with other viruses. If result is PRESUMPTIVE POSTIVE SARS-CoV-2 nucleic acids MAY BE PRESENT.   A presumptive positive result was obtained on the submitted specimen  and confirmed on repeat testing.  While 2019 novel coronavirus  (SARS-CoV-2) nucleic acids may be present in the submitted sample  additional confirmatory testing may be necessary for epidemiological  and / or clinical management purposes  to differentiate between  SARS-CoV-2 and other Sarbecovirus currently known to infect humans.  If clinically indicated additional testing with an alternate test  methodology [phone]) is advised. The SARS-CoV-2 RNA is generally  detectable in upper and lower respiratory sp ecimens during the acute  phase of infection. The expected result is Negative. Fact Sheet for Patients:  StrictlyIdeas.no Fact Sheet for Healthcare Providers: BankingDealers.co.za This test is not yet approved or cleared by the [place] FDA and has been authorized for detection and/or  diagnosis of SARS-CoV-2 by FDA under an Emergency Use Authorization (EUA).  This EUA will remain in effect (meaning this test can be used) for the duration of the COVID-19 declaration under Section 564(b)(1) of the Act, 21 U.S.C. section 360bbb-3(b)(1), unless the authorization is terminated or revoked sooner. Performed at [facility], [place] [address], [place], [place] [address]   MRSA PCR Screening     Status: None   Collection Time: 12/09/18 12:10 AM   Specimen: Nasopharyngeal  Result Value Ref Range Status   MRSA by PCR NEGATIVE NEGATIVE Final    Comment:        The GeneXpert MRSA Assay (FDA approved for NASAL specimens only), is one component of a comprehensive MRSA colonization surveillance program. It is not intended to diagnose MRSA infection nor to guide or monitor treatment for MRSA infections. Performed at [facility], [place] [address] [place], [place] [address]   Culture, blood (routine x 2)     Status: None   Collection Time: 12/09/18  9:47 PM   Specimen: BLOOD  Result Value Ref Range Status   Specimen Description BLOOD LEFT HAND  Final   Special Requests   Final    BOTTLES DRAWN AEROBIC ONLY Blood Culture results may not be optimal due to an inadequate volume of blood received in culture bottles   Culture   Final    NO GROWTH 5 DAYS Performed at [facility], [place] [address], [place], [place] [address]    Report Status 12/14/2018 FINAL  Final  Culture, blood (routine x 2)     Status: None   Collection Time: 12/09/18  9:55 PM   Specimen: BLOOD  Result Value Ref Range Status   Specimen Description BLOOD RIGHT HAND  Final  Special Requests   Final    BOTTLES DRAWN AEROBIC AND ANAEROBIC Blood Culture results may not be optimal due to an inadequate volume of blood received in culture bottles   Culture   Final    NO GROWTH 5 DAYS Performed at [facility], [address] [place], [place] [address]    Report Status 12/14/2018 FINAL   Final    Rexene Alberts, MD [facility] [phone] pager   [phone] cell 12/14/2018, 11:20 AM

## 2018-12-14 NOTE — Anesthesia Postprocedure Evaluation (Signed)
Anesthesia Post Note  Patient: [name]  Procedure(s) Performed: TRANSESOPHAGEAL ECHOCARDIOGRAM (TEE) (N/A )     Patient location during evaluation: PACU Anesthesia Type: MAC Level of consciousness: awake and alert Pain management: pain level controlled Vital Signs Assessment: post-procedure vital signs reviewed and stable Respiratory status: spontaneous breathing, nonlabored ventilation, respiratory function stable and patient connected to nasal cannula oxygen Cardiovascular status: stable and blood pressure returned to baseline Postop Assessment: no apparent nausea or vomiting Anesthetic complications: no    Last Vitals:  Vitals:   12/14/18 1034 12/14/18 1202  BP: 104/67 (!) 127/96  Pulse: 88   Resp: 19   Temp:  37.1 C  SpO2: 92% 96%    Last Pain:  Vitals:   12/14/18 1202  TempSrc: Oral  PainSc:                  Tiajuana Amass

## 2018-12-14 NOTE — Progress Notes (Signed)
  Echocardiogram Echocardiogram Transesophageal has been performed.  [name] 12/14/2018, 10:24 AM

## 2018-12-15 MED ORDER — KETOROLAC TROMETHAMINE 30 MG/ML IJ SOLN
30.0000 mg | Freq: Four times a day (QID) | INTRAMUSCULAR | Status: DC
  Administered 2018-12-15 – 2018-12-16 (×4): 30 mg via INTRAVENOUS
  Filled 2018-12-15 (×4): qty 1

## 2018-12-15 MED ORDER — CEPHALEXIN 500 MG PO CAPS: 500.0000 mg | ORAL_CAPSULE | Freq: Four times a day (QID) | ORAL | 0 refills | Status: AC

## 2018-12-15 MED ORDER — METHOCARBAMOL 500 MG PO TABS
500.0000 mg | ORAL_TABLET | Freq: Four times a day (QID) | ORAL | Status: DC | PRN
  Administered 2018-12-15 – 2018-12-18 (×7): 500 mg via ORAL
  Filled 2018-12-15 (×7): qty 1

## 2018-12-15 MED FILL — CEPHALEXIN 500 MG CAPSULE: 500 | 14 days supply | Qty: 56 | Fill #0

## 2018-12-15 NOTE — Consult Note (Signed)
Reason for Consult:Left shoulder pain Referring Physician: T Gonfa  [name] is an 48 y.o. female.  HPI: Maria SchwabMisty was admitted 7/2 with bacteremia and PNA. She also c/o left shoulder pain that has been severe and unrelenting since then. None of the treatments she has had has seemed to help. She denies any prior hx/o problems. She notes pain with just about any touch or movement and keeps it held close to her body. The pain is constant in nature and does not wax/wane. Essentially no alleviating factors. She is RHD.  Past Medical History:  Diagnosis Date  . Chronic kidney disease   . COPD (chronic obstructive pulmonary disease) (HCC)     Past Surgical History:  Procedure Laterality Date  . FOOT FRACTURE SURGERY    . R kidney stent    . TEE WITHOUT CARDIOVERSION N/A 12/14/2018   Procedure: TRANSESOPHAGEAL ECHOCARDIOGRAM (TEE);  Surgeon: Elease HashimotoNahser, Maria PingPhilip J, MD;  [facility];  Service: Cardiovascular;  Laterality: N/A;    Family History  Problem Relation Age of Onset  . COPD Mother   . Leukemia Father     Social History:  reports that she has been smoking. She has never used smokeless tobacco. She reports previous alcohol use. She reports previous drug use.  Allergies:  Allergies  Allergen Reactions  . Morphine And Related Hives  . Zofran [Ondansetron Hcl] Other (See Comments)    Pt said it made her really sick    Medications: I have reviewed the patient's current medications.  No results found for this or any previous visit (from the past 48 hour(s)).  No results found.  Review of Systems  Constitutional: Negative for chills, fever and weight loss.  HENT: Negative for ear discharge, ear pain, hearing loss and tinnitus.   Eyes: Negative for blurred vision, double vision, photophobia and pain.  Respiratory: Negative for cough, sputum production and shortness of breath.   Cardiovascular: Negative for chest pain.  Gastrointestinal: Negative for abdominal pain,  nausea and vomiting.  Genitourinary: Negative for dysuria, flank pain, frequency and urgency.  Musculoskeletal: Positive for joint pain (Left shoulder). Negative for back pain, falls, myalgias and neck pain.  Neurological: Negative for dizziness, tingling, sensory change, focal weakness, loss of consciousness and headaches.  Endo/Heme/Allergies: Does not bruise/bleed easily.  Psychiatric/Behavioral: Negative for depression, memory loss and substance abuse. The patient is not nervous/anxious.    Blood pressure 95/76, pulse 90, temperature 98.1 F (36.7 C), temperature source Oral, resp. rate 19, height 5\' 5"  (1.651 m), weight 61 kg, last menstrual period 11/13/2018, SpO2 93 %. Physical Exam  Constitutional: She appears well-developed and well-nourished. No distress.  HENT:  Head: Normocephalic and atraumatic.  Eyes: Conjunctivae are normal. Right eye exhibits no discharge. Left eye exhibits no discharge. No scleral icterus.  Neck: Normal range of motion.  Cardiovascular: Normal rate and regular rhythm.  Respiratory: Effort normal. No respiratory distress.  Musculoskeletal:     Comments: Left shoulder, elbow, wrist, digits- no skin wounds, severe TTP ant/lat shoulder, mod TTP posterior, minimal to mild TTP superior, no instability, no AROM 2/2 pain, severe pain with abduction/flexion PROM but will allow ~45 degrees PROM extension  Sens  Ax/R/M/U intact  Mot   Ax/ R/ PIN/ M/ AIN/ U intact  Rad 2+   Neurological: She is alert.  Skin: Skin is warm and dry. She is not diaphoretic.  Psychiatric: She has a normal mood and affect. Her behavior is normal.    Assessment/Plan: Left shoulder pain -- Given  normal joint appearance on MRI and ability to range her (at least in one direction) a decent amount septic arthritis seems unlikely. That leaves bursitis vs septic bursitis in the differential. Will try strong antiinflammatory overnight and reassess in AM by Dr. Doreatha Martin. Will make NPO after MN in  case surgical intervention needed.    Lisette Abu, PA-C Orthopedic Surgery [phone] 12/15/2018, 3:15 PM

## 2018-12-15 NOTE — Progress Notes (Signed)
Patient ID: [name], female   DOB: [birth year], 48 y.o.   MRN: [id number]         [facility]  Date of Admission:  12/08/2018     Total Antibiotic Days: 8       Day 7 cefazolin   ASSESSMENT: She has pneumonia complicated by MSSA bacteremia.  Both blood cultures done at [facility] are positive and 1 of 2 admission blood cultures here were positive on 7/02.  Repeat blood cultures are negative drawn 7/03.  There is no evidence of endocarditis by TTE or TEE. MRI of her left shoulder did not reveal any evidence of infection but there is clinically some concern for early infection given tenderness and in the setting of bacteremia. There is no bruising, swelling or redness.  Her left anterior chest pain is improved today.   Will plan on having her stay until Sunday to complete 11 days of IV therapy then have her continue cephalexin 500 mg QID for 2 weeks. We will see her at clinic visit at this time to consider if she needs further antibiotics orally. She is comfortable with this plan as well. She has made arrangements to stay with her daughter's father and his family until she is feeling better and more independent.   Her hepatitis C antibody test was positive; fortunately her Hep C RNA is negative which indicates she has spontaneously cleared infection previously. No treatment needed. HIV testing negative.   Will discuss with Dr. Cyndia Chandler if sling is appropriate for her as she is continually limited with this pain vs ace wrap to support.   She is very worried about changing her xanax and robaxin and does not feel she can tolerate the current dosing interval. Would like to discuss further with primary team.    PLAN: 1. Cefazolin to continue via peripheral IV until she leaves Sunday 7/12 2. Cephalexin 500 mg QID to start after discharge x 14 days 3. Please send antibiotics to patient for discharge from [place]    Follow up appointment arranged to  see Maria Madeira, NP July 28th at 1:45 pm.    Principal Problem:   Bacteremia due to methicillin susceptible Staphylococcus aureus (MSSA) Active Problems:   Acute on chronic respiratory failure with hypoxia (HCC)   Pneumonia   Hyponatremia   Hypokalemia   COPD (chronic obstructive pulmonary disease) (HCC)   Left shoulder pain   Normocytic anemia   Thrombocytopenia (HCC)   IVDU (intravenous drug user)   History of hepatitis C   Scheduled Meds: . busPIRone  7.5 mg Oral TID  . diclofenac sodium  2 g Topical QID  . ferrous sulfate  325 mg Oral BID WC  . folic acid  1 mg Oral Daily  . gabapentin  800 mg Oral QID  . multivitamin with minerals  1 tablet Oral Daily  . senna-docusate  2 tablet Oral BID  . sodium chloride flush  10-40 mL Intracatheter Q12H  . thiamine  100 mg Oral Daily   Continuous Infusions: .  ceFAZolin (ANCEF) IV 2 g (12/15/18 0313)   PRN Meds:.ALPRAZolam, guaiFENesin, methocarbamol, oxyCODONE, sodium chloride flush, traMADol   SUBJECTIVE: Her cough has resolved and now breathing comfortably on room air. Only complaint is ongoing unchanged left shoulder pain. She is requesting sling or ace wrap to help with the pain and support it better.   Also very concerned about changing her xanax dosing interval and robaxin. She "Feels as if she will  come unglued" if she needs to wait until noon for next dose.   Review of Systems: Review of Systems  Constitutional: Negative for chills, diaphoresis and fever.  Respiratory: Negative for cough, sputum production, shortness of breath and wheezing.   Cardiovascular: Positive for chest pain (anterior chest pain as previously described).  Gastrointestinal: Negative for abdominal pain, diarrhea, nausea and vomiting.  Genitourinary: Negative for dysuria.  Musculoskeletal: Positive for joint pain.  Neurological: Negative for headaches.  Psychiatric/Behavioral: Negative for depression. The patient is nervous/anxious.         Tearful     Allergies  Allergen Reactions  . Morphine And Related Hives  . Zofran [Ondansetron Hcl] Other (See Comments)    Pt said it made her really sick    OBJECTIVE: Vitals:   12/14/18 1939 12/14/18 2315 12/15/18 0246 12/15/18 0813  BP: 135/83 114/76 (!) 125/93 95/76  Pulse: 99 90    Resp:      Temp: 98.9 F (37.2 C) 98.6 F (37 C)  98.1 F (36.7 C)  TempSrc: Oral Oral  Oral  SpO2: 94% 97% 95% 93%  Weight:      Height:       Body mass index is 22.38 kg/m.  Physical Exam Constitutional:      Comments: Resting in bed. Guarding left shoulder and limited movement. Overall in no distress and appears comfortable.   Cardiovascular:     Rate and Rhythm: Normal rate and regular rhythm.     Heart sounds: No murmur.  Pulmonary:     Effort: Pulmonary effort is normal.     Breath sounds: Normal breath sounds. No wheezing or rhonchi.  Musculoskeletal:     Comments: Left shoulder still tender to palpation.  Anterior chest wall with less tenderness to palpation   Skin:    General: Skin is warm and dry.  Neurological:     Mental Status: She is oriented to person, place, and time.     Lab Results Lab Results  Component Value Date   WBC 4.7 12/11/2018   HGB 10.4 (L) 12/11/2018   HCT 32.2 (L) 12/11/2018   MCV 91.7 12/11/2018   PLT 85 (L) 12/11/2018    Lab Results  Component Value Date   CREATININE 0.63 12/13/2018   BUN <5 (L) 12/13/2018   NA 138 12/13/2018   K 3.7 12/13/2018   CL 100 12/13/2018   CO2 28 12/13/2018    Lab Results  Component Value Date   ALT 22 12/09/2018   AST 30 12/09/2018   ALKPHOS 48 12/09/2018   BILITOT 0.5 12/09/2018     Microbiology: Recent Results (from the past 240 hour(s))  Culture, blood (routine x 2) Call MD if unable to obtain prior to antibiotics being given     Status: Abnormal   Collection Time: 12/08/18 10:20 PM   Specimen: BLOOD LEFT HAND  Result Value Ref Range Status   Specimen Description BLOOD LEFT HAND  Final    Special Requests   Final    BOTTLES DRAWN AEROBIC ONLY Blood Culture results may not be optimal due to an inadequate volume of blood received in culture bottles   Culture  Setup Time   Final    GRAM POSITIVE COCCI IN CLUSTERS AEROBIC BOTTLE ONLY CRITICAL RESULT CALLED TO, READ BACK BY AND VERIFIED WITH: Hessie Diener. Dang PharmD 16:50 12/09/18 ([email or web address]) Performed at [facility], [address] [place], [place] [address]    Culture STAPHYLOCOCCUS AUREUS (A)  Final   Report Status  12/11/2018 FINAL  Final   Organism ID, Bacteria STAPHYLOCOCCUS AUREUS  Final      Susceptibility   Staphylococcus aureus - MIC*    CIPROFLOXACIN <=0.5 SENSITIVE Sensitive     ERYTHROMYCIN >=8 RESISTANT Resistant     GENTAMICIN <=0.5 SENSITIVE Sensitive     OXACILLIN <=0.25 SENSITIVE Sensitive     TETRACYCLINE <=1 SENSITIVE Sensitive     VANCOMYCIN 1 SENSITIVE Sensitive     TRIMETH/SULFA <=10 SENSITIVE Sensitive     CLINDAMYCIN <=0.25 SENSITIVE Sensitive     RIFAMPIN <=0.5 SENSITIVE Sensitive     Inducible Clindamycin NEGATIVE Sensitive     * STAPHYLOCOCCUS AUREUS  Culture, blood (routine x 2) Call MD if unable to obtain prior to antibiotics being given     Status: None   Collection Time: 12/08/18 10:20 PM   Specimen: BLOOD RIGHT ARM  Result Value Ref Range Status   Specimen Description BLOOD RIGHT ARM  Final   Special Requests   Final    BOTTLES DRAWN AEROBIC ONLY Blood Culture results may not be optimal due to an inadequate volume of blood received in culture bottles   Culture   Final    NO GROWTH 5 DAYS Performed at [facility], [address] [place], [place] [address]    Report Status 12/13/2018 FINAL  Final  Blood Culture ID Panel (Reflexed)     Status: Abnormal   Collection Time: 12/08/18 10:20 PM  Result Value Ref Range Status   Enterococcus species NOT DETECTED NOT DETECTED Final   Listeria monocytogenes NOT DETECTED NOT DETECTED Final   Staphylococcus species DETECTED (A) NOT DETECTED  Final    Comment: CRITICAL RESULT CALLED TO, READ BACK BY AND VERIFIED WITH: Hessie Diener. Dang PharmD 15:50 12/09/18 ([email or web address])    Staphylococcus aureus (BCID) DETECTED (A) NOT DETECTED Final    Comment: Methicillin (oxacillin) susceptible Staphylococcus aureus (MSSA). Preferred therapy is anti staphylococcal beta lactam antibiotic (Cefazolin or Nafcillin), unless clinically contraindicated. CRITICAL RESULT CALLED TO, READ BACK BY AND VERIFIED WITH: Hessie Diener. Dang PharmD 15:50 12/09/18 ([email or web address])    Methicillin resistance NOT DETECTED NOT DETECTED Final   Streptococcus species NOT DETECTED NOT DETECTED Final   Streptococcus agalactiae NOT DETECTED NOT DETECTED Final   Streptococcus pneumoniae NOT DETECTED NOT DETECTED Final   Streptococcus pyogenes NOT DETECTED NOT DETECTED Final   Acinetobacter baumannii NOT DETECTED NOT DETECTED Final   Enterobacteriaceae species NOT DETECTED NOT DETECTED Final   Enterobacter cloacae complex NOT DETECTED NOT DETECTED Final   Escherichia coli NOT DETECTED NOT DETECTED Final   Klebsiella oxytoca NOT DETECTED NOT DETECTED Final   Klebsiella pneumoniae NOT DETECTED NOT DETECTED Final   Proteus species NOT DETECTED NOT DETECTED Final   Serratia marcescens NOT DETECTED NOT DETECTED Final   Haemophilus influenzae NOT DETECTED NOT DETECTED Final   Neisseria meningitidis NOT DETECTED NOT DETECTED Final   Pseudomonas aeruginosa NOT DETECTED NOT DETECTED Final   Candida albicans NOT DETECTED NOT DETECTED Final   Candida glabrata NOT DETECTED NOT DETECTED Final   Candida krusei NOT DETECTED NOT DETECTED Final   Candida parapsilosis NOT DETECTED NOT DETECTED Final   Candida tropicalis NOT DETECTED NOT DETECTED Final    Comment: Performed at [facility], [address] [place], [place] [address]  SARS Coronavirus 2 (CEPHEID- Performed in [facility] hospital lab), Hosp Order     Status: None   Collection Time: 12/08/18 11:45 PM   Specimen: Nasopharyngeal Swab  Result Value  Ref Range Status  SARS Coronavirus 2 NEGATIVE NEGATIVE Final    Comment: (NOTE) If result is NEGATIVE SARS-CoV-2 target nucleic acids are NOT DETECTED. The SARS-CoV-2 RNA is generally detectable in upper and lower  respiratory specimens during the acute phase of infection. The lowest  concentration of SARS-CoV-2 viral copies this assay can detect is 250  copies / mL. A negative result does not preclude SARS-CoV-2 infection  and should not be used as the sole basis for treatment or other  patient management decisions.  A negative result may occur with  improper specimen collection / handling, submission of specimen other  than nasopharyngeal swab, presence of viral mutation(s) within the  areas targeted by this assay, and inadequate number of viral copies  (<250 copies / mL). A negative result must be combined with clinical  observations, patient history, and epidemiological information. If result is POSITIVE SARS-CoV-2 target nucleic acids are DETECTED. The SARS-CoV-2 RNA is generally detectable in upper and lower  respiratory specimens dur ing the acute phase of infection.  Positive  results are indicative of active infection with SARS-CoV-2.  Clinical  correlation with patient history and other diagnostic information is  necessary to determine patient infection status.  Positive results do  not rule out bacterial infection or co-infection with other viruses. If result is PRESUMPTIVE POSTIVE SARS-CoV-2 nucleic acids MAY BE PRESENT.   A presumptive positive result was obtained on the submitted specimen  and confirmed on repeat testing.  While 2019 novel coronavirus  (SARS-CoV-2) nucleic acids may be present in the submitted sample  additional confirmatory testing may be necessary for epidemiological  and / or clinical management purposes  to differentiate between  SARS-CoV-2 and other Sarbecovirus currently known to infect humans.  If clinically indicated additional testing with an  alternate test  methodology [phone]) is advised. The SARS-CoV-2 RNA is generally  detectable in upper and lower respiratory sp ecimens during the acute  phase of infection. The expected result is Negative. Fact Sheet for Patients:  BoilerBrush.com.cy Fact Sheet for Healthcare Providers: https://pope.com/ This test is not yet approved or cleared by the [place] FDA and has been authorized for detection and/or diagnosis of SARS-CoV-2 by FDA under an Emergency Use Authorization (EUA).  This EUA will remain in effect (meaning this test can be used) for the duration of the COVID-19 declaration under Section 564(b)(1) of the Act, 21 U.S.C. section 360bbb-3(b)(1), unless the authorization is terminated or revoked sooner. Performed at [facility], [address] [place], [place] [address]   MRSA PCR Screening     Status: None   Collection Time: 12/09/18 12:10 AM   Specimen: Nasopharyngeal  Result Value Ref Range Status   MRSA by PCR NEGATIVE NEGATIVE Final    Comment:        The GeneXpert MRSA Assay (FDA approved for NASAL specimens only), is one component of a comprehensive MRSA colonization surveillance program. It is not intended to diagnose MRSA infection nor to guide or monitor treatment for MRSA infections. Performed at [facility], [address], [place], [place] [address]   Culture, blood (routine x 2)     Status: None   Collection Time: 12/09/18  9:47 PM   Specimen: BLOOD  Result Value Ref Range Status   Specimen Description BLOOD LEFT HAND  Final   Special Requests   Final    BOTTLES DRAWN AEROBIC ONLY Blood Culture results may not be optimal due to an inadequate volume of blood received in culture bottles  Culture   Final    NO GROWTH 5 DAYS Performed at [facility], [address] [place], [place] [address]    Report Status 12/14/2018 FINAL  Final  Culture, blood (routine x 2)     Status:  None   Collection Time: 12/09/18  9:55 PM   Specimen: BLOOD  Result Value Ref Range Status   Specimen Description BLOOD RIGHT HAND  Final   Special Requests   Final    BOTTLES DRAWN AEROBIC AND ANAEROBIC Blood Culture results may not be optimal due to an inadequate volume of blood received in culture bottles   Culture   Final    NO GROWTH 5 DAYS Performed at [facility], [address] [place], [place] [address]    Report Status 12/14/2018 FINAL  Final    Rexene AlbertsStephanie Dixon, MD [facility] [phone] pager   [phone] 12/15/2018, 10:34 AM

## 2018-12-15 NOTE — Progress Notes (Signed)
Pt oxygen 99% 2L [place], no longer confused but stating she feels off.  Monitors showing pt in Afib.  After 10-15 minutes pt back in NSR.  Rapid response called to help assess patient.  Crackles auscultated in bases.  MD notified. No orders given.

## 2018-12-15 NOTE — Progress Notes (Signed)
Per RN patient O2 sat 81% on RA with confusion then inc irregular HR. Upon my arrival patient sitting up in the bed alert and oriented, states she still feels "odd".  RR 18-20  O2 sat 97% on 2L [place]  SR 94.  Lung sounds with crackles in bases.  Recommended Lasix.  RN notified MD no new orders.

## 2018-12-15 NOTE — Progress Notes (Signed)
Pt desatted to 81% on room air. Tele notified RN. Upon assessment, patient awake but seeming drowsy and slurred.  Pt confused about situation and not speaking well. Placed on 2L [place] and oxygen 96%.  MD notified

## 2018-12-15 NOTE — Progress Notes (Signed)
Pharmacy Antibiotic Note  [name] is a 48 y.o. female admitted on 12/08/2018 with SOB.  Patient has a history of IVDU.  Blood culture growing MSSA in 1 of 2 bottles thus far.   Continues on Cefazolin for 14 days total  Plan: Continue Ancef 2gm IV Q8H   Height: 5\' 5"  (165.1 cm) Weight: 134 lb 7.7 oz (61 kg) IBW/kg (Calculated) : 57  Temp (24hrs), Avg:98.5 F (36.9 C), Min:98.1 F (36.7 C), Max:98.9 F (37.2 C)  Recent Labs  Lab 12/08/18 2223 12/09/18 0946 12/09/18 1252 12/11/18 0718 12/13/18 0612  WBC 4.3 3.4*  --  4.7  --   CREATININE 0.78 0.50  --  0.53 0.63  LATICACIDVEN  --   --  1.3  --   --     Estimated Creatinine Clearance: 79.9 mL/min (by C-G formula based on SCr of 0.63 mg/dL).    Allergies  Allergen Reactions  . Morphine And Related Hives  . Zofran [Ondansetron Hcl] Other (See Comments)    Pt said it made her really sick    Azith 7/2 >> 7/3 CTX 7/2 >> 7/3 Ancef 7/3 >>  7/2 covid - negative 7/2 MRSA PCR - negative 7/2 BCx - GPC 1 of 2 (BCID MSSA)  Anette Guarneri, PharmD Please utilize Amion for appropriate phone number to reach the unit pharmacist ([place])   12/15/2018, 9:14 AM

## 2018-12-15 NOTE — Progress Notes (Signed)
PROGRESS NOTE  [name] UJW:[id number] DOB: September 01, 1970   PCP: No primary care provider on file.  Patient is from: Home  DOA: 12/08/2018 LOS: 7  Brief Narrative / Interim history: 48 year old female with history of IVDU and emphysema who presented to [facility] with acute respiratory failure and hypoxemia and found to have bacteremia.  Subjective: No major events overnight of this morning.  Complains about Robaxin reduced and Xanax changed to Klonopin.  Robaxin was started this admission.  She says she has been on Xanax for years.  She says she has tried different meds for anxiety and none worked for her.  Continues to endorse left shoulder pain and costochondral pains.  Denies dyspnea, GI or GU symptoms.  Objective: Vitals:   12/15/18 0246 12/15/18 0813 12/15/18 1000 12/15/18 1700  BP: (!) 125/93 95/76    Pulse:      Resp:      Temp:  98.1 F (36.7 C)    TempSrc:  Oral    SpO2: 95% 93% 93% 97%  Weight:      Height:        Intake/Output Summary (Last 24 hours) at 12/15/2018 1726 Last data filed at 12/15/2018 0800 Gross per 24 hour  Intake 410 ml  Output -  Net 410 ml   Filed Weights   12/08/18 2000 12/08/18 2030 12/13/18 0736  Weight: 61.7 kg 61 kg 61 kg    Examination:  GENERAL: No acute distress.  Appears well.  HEENT: MMM.  Poor dentition.  Vision and hearing grossly intact.  NECK: Supple.  No apparent JVD. LUNGS:  No IWOB. Good air movement bilaterally. HEART:  RRR. Heart sounds normal.  ABD: Bowel sounds present. Soft. Non tender.  MSK/EXT: Limited range of motion in left shoulder.  Tenderness to palpation.  Active range of motion limited by pain.  No overlying skin erythema or swelling. SKIN: no apparent skin lesion or wound NEURO: Awake, alert and oriented appropriately.  No gross deficit.  PSYCH: Calm. Normal affect.  I have personally reviewed the following labs and images:  Radiology Studies: No results found.  Microbiology: Recent Results (from  the past 240 hour(s))  Culture, blood (routine x 2) Call MD if unable to obtain prior to antibiotics being given     Status: Abnormal   Collection Time: 12/08/18 10:20 PM   Specimen: BLOOD LEFT HAND  Result Value Ref Range Status   Specimen Description BLOOD LEFT HAND  Final   Special Requests   Final    BOTTLES DRAWN AEROBIC ONLY Blood Culture results may not be optimal due to an inadequate volume of blood received in culture bottles   Culture  Setup Time   Final    GRAM POSITIVE COCCI IN CLUSTERS AEROBIC BOTTLE ONLY CRITICAL RESULT CALLED TO, READ BACK BY AND VERIFIED WITH: Hessie Diener PharmD 16:50 12/09/18 ([email or web address]) Performed at [facility], [address], [place], [place] [address]    Culture STAPHYLOCOCCUS AUREUS (A)  Final   Report Status 12/11/2018 FINAL  Final   Organism ID, Bacteria STAPHYLOCOCCUS AUREUS  Final      Susceptibility   Staphylococcus aureus - MIC*    CIPROFLOXACIN <=0.5 SENSITIVE Sensitive     ERYTHROMYCIN >=8 RESISTANT Resistant     GENTAMICIN <=0.5 SENSITIVE Sensitive     OXACILLIN <=0.25 SENSITIVE Sensitive     TETRACYCLINE <=1 SENSITIVE Sensitive     VANCOMYCIN 1 SENSITIVE Sensitive     TRIMETH/SULFA <=10 SENSITIVE Sensitive     CLINDAMYCIN <=  0.25 SENSITIVE Sensitive     RIFAMPIN <=0.5 SENSITIVE Sensitive     Inducible Clindamycin NEGATIVE Sensitive     * STAPHYLOCOCCUS AUREUS  Culture, blood (routine x 2) Call MD if unable to obtain prior to antibiotics being given     Status: None   Collection Time: 12/08/18 10:20 PM   Specimen: BLOOD RIGHT ARM  Result Value Ref Range Status   Specimen Description BLOOD RIGHT ARM  Final   Special Requests   Final    BOTTLES DRAWN AEROBIC ONLY Blood Culture results may not be optimal due to an inadequate volume of blood received in culture bottles   Culture   Final    NO GROWTH 5 DAYS Performed at [facility], [place] [address] [place], [place] [address]    Report Status 12/13/2018 FINAL  Final  Blood  Culture ID Panel (Reflexed)     Status: Abnormal   Collection Time: 12/08/18 10:20 PM  Result Value Ref Range Status   Enterococcus species NOT DETECTED NOT DETECTED Final   Listeria monocytogenes NOT DETECTED NOT DETECTED Final   Staphylococcus species DETECTED (A) NOT DETECTED Final    Comment: CRITICAL RESULT CALLED TO, READ BACK BY AND VERIFIED WITH: Diona Browner PharmD 15:50 12/09/18 ([email or web address])    Staphylococcus aureus (BCID) DETECTED (A) NOT DETECTED Final    Comment: Methicillin (oxacillin) susceptible Staphylococcus aureus (MSSA). Preferred therapy is anti staphylococcal beta lactam antibiotic (Cefazolin or Nafcillin), unless clinically contraindicated. CRITICAL RESULT CALLED TO, READ BACK BY AND VERIFIED WITH: Diona Browner PharmD 15:50 12/09/18 ([email or web address])    Methicillin resistance NOT DETECTED NOT DETECTED Final   Streptococcus species NOT DETECTED NOT DETECTED Final   Streptococcus agalactiae NOT DETECTED NOT DETECTED Final   Streptococcus pneumoniae NOT DETECTED NOT DETECTED Final   Streptococcus pyogenes NOT DETECTED NOT DETECTED Final   Acinetobacter baumannii NOT DETECTED NOT DETECTED Final   Enterobacteriaceae species NOT DETECTED NOT DETECTED Final   Enterobacter cloacae complex NOT DETECTED NOT DETECTED Final   Escherichia coli NOT DETECTED NOT DETECTED Final   Klebsiella oxytoca NOT DETECTED NOT DETECTED Final   Klebsiella pneumoniae NOT DETECTED NOT DETECTED Final   Proteus species NOT DETECTED NOT DETECTED Final   Serratia marcescens NOT DETECTED NOT DETECTED Final   Haemophilus influenzae NOT DETECTED NOT DETECTED Final   Neisseria meningitidis NOT DETECTED NOT DETECTED Final   Pseudomonas aeruginosa NOT DETECTED NOT DETECTED Final   Candida albicans NOT DETECTED NOT DETECTED Final   Candida glabrata NOT DETECTED NOT DETECTED Final   Candida krusei NOT DETECTED NOT DETECTED Final   Candida parapsilosis NOT DETECTED NOT DETECTED Final   Candida tropicalis NOT DETECTED NOT  DETECTED Final    Comment: Performed at [facility], [place] [address], [place], [place] [address]  SARS Coronavirus 2 (CEPHEID- Performed in [place] hospital lab), Hosp Order     Status: None   Collection Time: 12/08/18 11:45 PM   Specimen: Nasopharyngeal Swab  Result Value Ref Range Status   SARS Coronavirus 2 NEGATIVE NEGATIVE Final    Comment: (NOTE) If result is NEGATIVE SARS-CoV-2 target nucleic acids are NOT DETECTED. The SARS-CoV-2 RNA is generally detectable in upper and lower  respiratory specimens during the acute phase of infection. The lowest  concentration of SARS-CoV-2 viral copies this assay can detect is 250  copies / mL. A negative result does not preclude SARS-CoV-2 infection  and should not be used as the sole basis for treatment or other  patient management  decisions.  A negative result may occur with  improper specimen collection / handling, submission of specimen other  than nasopharyngeal swab, presence of viral mutation(s) within the  areas targeted by this assay, and inadequate number of viral copies  (<250 copies / mL). A negative result must be combined with clinical  observations, patient history, and epidemiological information. If result is POSITIVE SARS-CoV-2 target nucleic acids are DETECTED. The SARS-CoV-2 RNA is generally detectable in upper and lower  respiratory specimens dur ing the acute phase of infection.  Positive  results are indicative of active infection with SARS-CoV-2.  Clinical  correlation with patient history and other diagnostic information is  necessary to determine patient infection status.  Positive results do  not rule out bacterial infection or co-infection with other viruses. If result is PRESUMPTIVE POSTIVE SARS-CoV-2 nucleic acids MAY BE PRESENT.   A presumptive positive result was obtained on the submitted specimen  and confirmed on repeat testing.  While 2019 novel coronavirus  (SARS-CoV-2) nucleic acids may be  present in the submitted sample  additional confirmatory testing may be necessary for epidemiological  and / or clinical management purposes  to differentiate between  SARS-CoV-2 and other Sarbecovirus currently known to infect humans.  If clinically indicated additional testing with an alternate test  methodology [phone]([id number]) is advised. The SARS-CoV-2 RNA is generally  detectable in upper and lower respiratory sp ecimens during the acute  phase of infection. The expected result is Negative. Fact Sheet for Patients:  BoilerBrush.com.cyhttps://www.fda.gov/media/136312/download Fact Sheet for Healthcare Providers: https://pope.com/https://www.fda.gov/media/136313/download This test is not yet approved or cleared by the [place] FDA and has been authorized for detection and/or diagnosis of SARS-CoV-2 by FDA under an Emergency Use Authorization (EUA).  This EUA will remain in effect (meaning this test can be used) for the duration of the COVID-19 declaration under Section 564(b)(1) of the Act, 21 U.S.C. section 360bbb-3(b)(1), unless the authorization is terminated or revoked sooner. Performed at [facility], [address] [place], [place] [address]   MRSA PCR Screening     Status: None   Collection Time: 12/09/18 12:10 AM   Specimen: Nasopharyngeal  Result Value Ref Range Status   MRSA by PCR NEGATIVE NEGATIVE Final    Comment:        The GeneXpert MRSA Assay (FDA approved for NASAL specimens only), is one component of a comprehensive MRSA colonization surveillance program. It is not intended to diagnose MRSA infection nor to guide or monitor treatment for MRSA infections. Performed at [facility], [address] [place], [place] [address]   Culture, blood (routine x 2)     Status: None   Collection Time: 12/09/18  9:47 PM   Specimen: BLOOD  Result Value Ref Range Status   Specimen Description BLOOD LEFT HAND  Final   Special Requests   Final    BOTTLES DRAWN AEROBIC ONLY Blood Culture  results may not be optimal due to an inadequate volume of blood received in culture bottles   Culture   Final    NO GROWTH 5 DAYS Performed at [facility], [address] [place], [place] [address]    Report Status 12/14/2018 FINAL  Final  Culture, blood (routine x 2)     Status: None   Collection Time: 12/09/18  9:55 PM   Specimen: BLOOD  Result Value Ref Range Status   Specimen Description BLOOD RIGHT HAND  Final   Special Requests   Final  BOTTLES DRAWN AEROBIC AND ANAEROBIC Blood Culture results may not be optimal due to an inadequate volume of blood received in culture bottles   Culture   Final    NO GROWTH 5 DAYS Performed at [facility], [address] [place], [place] [address]    Report Status 12/14/2018 FINAL  Final    Sepsis Labs: Invalid input(s): PROCALCITONIN, LACTICIDVEN  Urine analysis:    Component Value Date/Time   COLORURINE YELLOW 05/10/2010 1929   APPEARANCEUR TURBID (A) 05/10/2010 1929   LABSPEC 1.020 05/10/2010 1929   PHURINE 7.0 05/10/2010 1929   GLUCOSEU NEGATIVE 05/10/2010 1929   HGBUR TRACE (A) 05/10/2010 1929   BILIRUBINUR NEGATIVE 05/10/2010 1929   KETONESUR NEGATIVE 05/10/2010 1929   PROTEINUR 30 (A) 05/10/2010 1929   UROBILINOGEN 1.0 05/10/2010 1929   NITRITE POSITIVE (A) 05/10/2010 1929   LEUKOCYTESUR LARGE (A) 05/10/2010 1929    Anemia Panel: No results for input(s): VITAMINB12, FOLATE, FERRITIN, TIBC, IRON, RETICCTPCT in the last 72 hours.  Thyroid Function Tests: No results for input(s): TSH, T4TOTAL, FREET4, T3FREE, THYROIDAB in the last 72 hours.  Lipid Profile: No results for input(s): CHOL, HDL, LDLCALC, TRIG, CHOLHDL, LDLDIRECT in the last 72 hours.  CBG: Recent Labs  Lab 12/08/18 2235  GLUCAP 110*    HbA1C: No results for input(s): HGBA1C in the last 72 hours.  BNP (last 3 results): No results for input(s): PROBNP in the last 8760 hours.  Cardiac Enzymes: No results for input(s): CKTOTAL, CKMB,  CKMBINDEX, TROPONINI in the last 168 hours.  Coagulation Profile: No results for input(s): INR, PROTIME in the last 168 hours.  Liver Function Tests: Recent Labs  Lab 12/08/18 2223 12/09/18 0946  AST 21 30  ALT 18 22  ALKPHOS 46 48  BILITOT 1.0 0.5  PROT 7.0 6.2*  ALBUMIN 3.2* 2.7*   No results for input(s): LIPASE, AMYLASE in the last 168 hours. No results for input(s): AMMONIA in the last 168 hours.  Basic Metabolic Panel: Recent Labs  Lab 12/08/18 2223 12/09/18 0946 12/11/18 0718 12/13/18 0612  NA 130* 137 137 138  K 3.2* 4.3 4.2 3.7  CL 93* 102 102 100  CO2 26 24 25 28   GLUCOSE 122* 158* 130* 95  BUN 8 10 9  <5*  CREATININE 0.78 0.50 0.53 0.63  CALCIUM 8.1* 8.1* 7.8* 8.4*  MG  --  2.3  --   --    GFR: Estimated Creatinine Clearance: 79.9 mL/min (by C-G formula based on SCr of 0.63 mg/dL).  CBC: Recent Labs  Lab 12/08/18 2223 12/09/18 0946 12/11/18 0718  WBC 4.3 3.4* 4.7  NEUTROABS 3.5  --   --   HGB 11.1* 10.2* 10.4*  HCT 32.8* 30.0* 32.2*  MCV 89.1 88.0 91.7  PLT 53* 45* 85*    Procedures:  TEE on 7/8 without vegetation  Microbiology summarized: Blood culture MSSA  Assessment & Plan: MSSA bacteremia: TTE and TEE without vegetation. -Continue Ancef until 7/12, then oral Keflex 500 mg 4 times daily for 2 weeks. -Long-acting oritavancin IV on discharge should she leave prior to recommended 14 days IV course -Outpatient follow-up with ID. -Appreciate IDs help  Acute respiratory failure with hypoxemia Community-acquired pneumonia -CTA chest negative for PE -Antibiotic as above -Continue bronchodilators as needed  Left shoulder pain/chronic pain syndrome -MRI left shoulder on 7/5 with moderate subacromial and subdeltoid bursitis but no other acute finding. -Continue Voltaren gel -Continue gabapentin 800 mg 4 times daily -PRN oxycodone and Robaxin -Orthopedic surgery  consult for possible aspiration  Mixed iron deficiency/anemia of chronic  disease: Iron saturation 8% -Hemoglobin stable -Continue iron supplementation  Thrombocytopenia: Likely due to alcohol although she denies. -Improving -Continue trending  Alcohol use disorder: She denies drinking. -Discontinue CIWA protocol as she is on Xanax. -Continue multivitamin  Substance use disorder: UDS positive for cocaine -Cessation counseling  History of hep C: Quantitative hep C RNA negative suggesting previous exposure and clearance.  HIV negative  Anxiety: On Xanax 1 mg 3 times daily at home -Continue Xanax-narcotic database reviewed. -Add BuSpar  DVT prophylaxis: SCD due to thrombocytopenia Code Status: Full code Family Communication: Patient and RN.  Available if question. Disposition Plan: Remains inpatient for MSSA bacteremia on IV Ancef through 7/12.  Consultants: ID, cardiology, orthopedic surgery  Antimicrobials: Anti-infectives (From admission, onward)   Start     Dose/Rate Route Frequency Ordered Stop   12/19/18 0000  cephALEXin (KEFLEX) 500 MG capsule     500 mg Oral 4 times daily 12/15/18 1253 01/02/19 2359   12/09/18 2000  ceFAZolin (ANCEF) IVPB 2g/100 mL premix     2 g 200 mL/hr over 30 Minutes Intravenous Every 8 hours 12/09/18 1634     12/08/18 2100  cefTRIAXone (ROCEPHIN) 2 g in sodium chloride 0.9 % 100 mL IVPB  Status:  Discontinued     2 g 200 mL/hr over 30 Minutes Intravenous Every 24 hours 12/08/18 2059 12/09/18 1627   12/08/18 2100  azithromycin (ZITHROMAX) 500 mg in sodium chloride 0.9 % 250 mL IVPB  Status:  Discontinued     500 mg 250 mL/hr over 60 Minutes Intravenous Every 24 hours 12/08/18 2059 12/09/18 1627      Sch Meds:  Scheduled Meds: . busPIRone  7.5 mg Oral TID  . diclofenac sodium  2 g Topical QID  . ferrous sulfate  325 mg Oral BID WC  . folic acid  1 mg Oral Daily  . gabapentin  800 mg Oral QID  . ketorolac  30 mg Intravenous Q6H  . multivitamin with minerals  1 tablet Oral Daily  . senna-docusate  2 tablet Oral  BID  . sodium chloride flush  10-40 mL Intracatheter Q12H  . thiamine  100 mg Oral Daily   Continuous Infusions: .  ceFAZolin (ANCEF) IV 2 g (12/15/18 1320)   PRN Meds:.ALPRAZolam, guaiFENesin, methocarbamol, oxyCODONE, sodium chloride flush, traMADol   Farooq Petrovich T. Oralee Rapaport [facility]  If 7PM-7AM, please contact night-coverage [email or web address] Password [email or web address] 12/15/2018, 5:26 PM

## 2018-12-16 LAB — SEDIMENTATION RATE

## 2018-12-16 LAB — C-REACTIVE PROTEIN: CRP: 7 mg/dL — ABNORMAL HIGH (ref <1.0)

## 2018-12-16 MED ORDER — LIDOCAINE HCL 1 % IJ SOLN
5.0000 mL | Freq: Once | INTRAMUSCULAR | Status: AC
  Administered 2018-12-16: 5 mL via INTRADERMAL
  Filled 2018-12-16: qty 5

## 2018-12-16 MED ORDER — TRIAMCINOLONE ACETONIDE 40 MG/ML IJ SUSP
40.0000 mg | Freq: Once | INTRAMUSCULAR | Status: AC
  Administered 2018-12-16: 40 mg via INTRA_ARTICULAR
  Filled 2018-12-16: qty 1

## 2018-12-16 MED ORDER — KETOROLAC TROMETHAMINE 30 MG/ML IJ SOLN
15.0000 mg | Freq: Four times a day (QID) | INTRAMUSCULAR | Status: DC
  Administered 2018-12-16 – 2018-12-18 (×8): 15 mg via INTRAVENOUS
  Filled 2018-12-16 (×8): qty 1

## 2018-12-16 MED ORDER — LIDOCAINE HCL 1 % IJ SOLN
5.0000 mL | Freq: Once | INTRAMUSCULAR | Status: DC
  Filled 2018-12-16: qty 5

## 2018-12-16 MED ORDER — DEXTROSE 10 % IV SOLN
INTRAVENOUS | Status: DC
  Administered 2018-12-16: 10:00:00 via INTRAVENOUS

## 2018-12-16 NOTE — Progress Notes (Signed)
PROGRESS NOTE  [name] RUE:[id number] DOB: 08/24/1970   PCP: No primary care provider on file.  Patient is from: Home  DOA: 12/08/2018 LOS: 8  Brief Narrative / Interim history: 48 year old female with history of IVDU and emphysema who presented to [facility] with acute respiratory failure and hypoxemia and found to have MSSA bacteremia.  TTE and TEE without vegetation.  Subjective: Only had an episode of A. fib, desaturation to 81%, confusion and slurring yesterday evening.  She was put on 2 L by nasal cannula and recovered saturation.  No event captured on telemetry.  Denies any event overnight.  Continues to endorse left shoulder pain.  Also complaining about n.p.o. status since she is getting anxious and shaky.  She denies dyspnea, chest pain, palpitation, dizziness, GI or GU symptoms this morning.   Objective: Vitals:   12/15/18 1959 12/16/18 0034 12/16/18 0753 12/16/18 1229  BP: 91/63 95/69 109/62 120/65  Pulse: 88 79 74 (!) 103  Resp: 18 18 19 17   Temp: 98.8 F (37.1 C) 97.8 F (36.6 C) 97.8 F (36.6 C) 97.8 F (36.6 C)  TempSrc: Oral Oral Oral Oral  SpO2: 95% 95% 98% 95%  Weight:      Height:       No intake or output data in the 24 hours ending 12/16/18 1358 Filed Weights   12/08/18 2000 12/08/18 2030 12/13/18 0736  Weight: 61.7 kg 61 kg 61 kg    Examination:  GENERAL: No acute distress.  Appears well.  HEENT: MMM.  Vision and hearing grossly intact.  NECK: Supple.  No apparent JVD. LUNGS:  No IWOB. Good air movement bilaterally. HEART:  RRR. Heart sounds normal.  ABD: Bowel sounds present. Soft. Non tender.  MSK/EXT: Tenderness to palpation.  Active ROM at left shoulder limited by pain.  No swelling overlying skin erythema. SKIN: no apparent skin lesion or wound NEURO: Awake, alert and oriented appropriately.  No gross deficit.  PSYCH: Calm. Normal affect.  I have personally reviewed the following labs and images:  Radiology Studies: No results  found.  Microbiology: Recent Results (from the past 240 hour(s))  Culture, blood (routine x 2) Call MD if unable to obtain prior to antibiotics being given     Status: Abnormal   Collection Time: 12/08/18 10:20 PM   Specimen: BLOOD LEFT HAND  Result Value Ref Range Status   Specimen Description BLOOD LEFT HAND  Final   Special Requests   Final    BOTTLES DRAWN AEROBIC ONLY Blood Culture results may not be optimal due to an inadequate volume of blood received in culture bottles   Culture  Setup Time   Final    GRAM POSITIVE COCCI IN CLUSTERS AEROBIC BOTTLE ONLY CRITICAL RESULT CALLED TO, READ BACK BY AND VERIFIED WITH: Hessie Diener. Dang PharmD 16:50 12/09/18 ([email or web address]) Performed at [facility], [address] [place], [place] [address]    Culture STAPHYLOCOCCUS AUREUS (A)  Final   Report Status 12/11/2018 FINAL  Final   Organism ID, Bacteria STAPHYLOCOCCUS AUREUS  Final      Susceptibility   Staphylococcus aureus - MIC*    CIPROFLOXACIN <=0.5 SENSITIVE Sensitive     ERYTHROMYCIN >=8 RESISTANT Resistant     GENTAMICIN <=0.5 SENSITIVE Sensitive     OXACILLIN <=0.25 SENSITIVE Sensitive     TETRACYCLINE <=1 SENSITIVE Sensitive     VANCOMYCIN 1 SENSITIVE Sensitive     TRIMETH/SULFA <=10 SENSITIVE Sensitive     CLINDAMYCIN <=0.25 SENSITIVE Sensitive  RIFAMPIN <=0.5 SENSITIVE Sensitive     Inducible Clindamycin NEGATIVE Sensitive     * STAPHYLOCOCCUS AUREUS  Culture, blood (routine x 2) Call MD if unable to obtain prior to antibiotics being given     Status: None   Collection Time: 12/08/18 10:20 PM   Specimen: BLOOD RIGHT ARM  Result Value Ref Range Status   Specimen Description BLOOD RIGHT ARM  Final   Special Requests   Final    BOTTLES DRAWN AEROBIC ONLY Blood Culture results may not be optimal due to an inadequate volume of blood received in culture bottles   Culture   Final    NO GROWTH 5 DAYS Performed at [facility], [place] [address] [place], [place] [address]     Report Status 12/13/2018 FINAL  Final  Blood Culture ID Panel (Reflexed)     Status: Abnormal   Collection Time: 12/08/18 10:20 PM  Result Value Ref Range Status   Enterococcus species NOT DETECTED NOT DETECTED Final   Listeria monocytogenes NOT DETECTED NOT DETECTED Final   Staphylococcus species DETECTED (A) NOT DETECTED Final    Comment: CRITICAL RESULT CALLED TO, READ BACK BY AND VERIFIED WITH: Diona Browner PharmD 15:50 12/09/18 ([email or web address])    Staphylococcus aureus (BCID) DETECTED (A) NOT DETECTED Final    Comment: Methicillin (oxacillin) susceptible Staphylococcus aureus (MSSA). Preferred therapy is anti staphylococcal beta lactam antibiotic (Cefazolin or Nafcillin), unless clinically contraindicated. CRITICAL RESULT CALLED TO, READ BACK BY AND VERIFIED WITH: Diona Browner PharmD 15:50 12/09/18 ([email or web address])    Methicillin resistance NOT DETECTED NOT DETECTED Final   Streptococcus species NOT DETECTED NOT DETECTED Final   Streptococcus agalactiae NOT DETECTED NOT DETECTED Final   Streptococcus pneumoniae NOT DETECTED NOT DETECTED Final   Streptococcus pyogenes NOT DETECTED NOT DETECTED Final   Acinetobacter baumannii NOT DETECTED NOT DETECTED Final   Enterobacteriaceae species NOT DETECTED NOT DETECTED Final   Enterobacter cloacae complex NOT DETECTED NOT DETECTED Final   Escherichia coli NOT DETECTED NOT DETECTED Final   Klebsiella oxytoca NOT DETECTED NOT DETECTED Final   Klebsiella pneumoniae NOT DETECTED NOT DETECTED Final   Proteus species NOT DETECTED NOT DETECTED Final   Serratia marcescens NOT DETECTED NOT DETECTED Final   Haemophilus influenzae NOT DETECTED NOT DETECTED Final   Neisseria meningitidis NOT DETECTED NOT DETECTED Final   Pseudomonas aeruginosa NOT DETECTED NOT DETECTED Final   Candida albicans NOT DETECTED NOT DETECTED Final   Candida glabrata NOT DETECTED NOT DETECTED Final   Candida krusei NOT DETECTED NOT DETECTED Final   Candida parapsilosis NOT DETECTED NOT DETECTED Final    Candida tropicalis NOT DETECTED NOT DETECTED Final    Comment: Performed at [facility], [place] [address], [place], [place] [address]  SARS Coronavirus 2 (CEPHEID- Performed in [place] [facility] lab), Hosp Order     Status: None   Collection Time: 12/08/18 11:45 PM   Specimen: Nasopharyngeal Swab  Result Value Ref Range Status   SARS Coronavirus 2 NEGATIVE NEGATIVE Final    Comment: (NOTE) If result is NEGATIVE SARS-CoV-2 target nucleic acids are NOT DETECTED. The SARS-CoV-2 RNA is generally detectable in upper and lower  respiratory specimens during the acute phase of infection. The lowest  concentration of SARS-CoV-2 viral copies this assay can detect is 250  copies / mL. A negative result does not preclude SARS-CoV-2 infection  and should not be used as the sole basis for treatment or other  patient management decisions.  A negative result may occur  with  improper specimen collection / handling, submission of specimen other  than nasopharyngeal swab, presence of viral mutation(s) within the  areas targeted by this assay, and inadequate number of viral copies  (<250 copies / mL). A negative result must be combined with clinical  observations, patient history, and epidemiological information. If result is POSITIVE SARS-CoV-2 target nucleic acids are DETECTED. The SARS-CoV-2 RNA is generally detectable in upper and lower  respiratory specimens dur ing the acute phase of infection.  Positive  results are indicative of active infection with SARS-CoV-2.  Clinical  correlation with patient history and other diagnostic information is  necessary to determine patient infection status.  Positive results do  not rule out bacterial infection or co-infection with other viruses. If result is PRESUMPTIVE POSTIVE SARS-CoV-2 nucleic acids MAY BE PRESENT.   A presumptive positive result was obtained on the submitted specimen  and confirmed on repeat testing.  While 2019 novel coronavirus   (SARS-CoV-2) nucleic acids may be present in the submitted sample  additional confirmatory testing may be necessary for epidemiological  and / or clinical management purposes  to differentiate between  SARS-CoV-2 and other Sarbecovirus currently known to infect humans.  If clinically indicated additional testing with an alternate test  methodology [phone]) is advised. The SARS-CoV-2 RNA is generally  detectable in upper and lower respiratory sp ecimens during the acute  phase of infection. The expected result is Negative. Fact Sheet for Patients:  BoilerBrush.com.cy Fact Sheet for Healthcare Providers: https://pope.com/ This test is not yet approved or cleared by the [place] FDA and has been authorized for detection and/or diagnosis of SARS-CoV-2 by FDA under an Emergency Use Authorization (EUA).  This EUA will remain in effect (meaning this test can be used) for the duration of the COVID-19 declaration under Section 564(b)(1) of the Act, 21 U.S.C. section 360bbb-3(b)(1), unless the authorization is terminated or revoked sooner. Performed at [facility], [address], [place], [place] [address]   MRSA PCR Screening     Status: None   Collection Time: 12/09/18 12:10 AM   Specimen: Nasopharyngeal  Result Value Ref Range Status   MRSA by PCR NEGATIVE NEGATIVE Final    Comment:        The GeneXpert MRSA Assay (FDA approved for NASAL specimens only), is one component of a comprehensive MRSA colonization surveillance program. It is not intended to diagnose MRSA infection nor to guide or monitor treatment for MRSA infections. Performed at [facility], [address], [place], [place] [address]   Culture, blood (routine x 2)     Status: None   Collection Time: 12/09/18  9:47 PM   Specimen: BLOOD  Result Value Ref Range Status   Specimen Description BLOOD LEFT HAND  Final   Special Requests   Final    BOTTLES  DRAWN AEROBIC ONLY Blood Culture results may not be optimal due to an inadequate volume of blood received in culture bottles   Culture   Final    NO GROWTH 5 DAYS Performed at [facility], [address] [place], [place] [address]    Report Status 12/14/2018 FINAL  Final  Culture, blood (routine x 2)     Status: None   Collection Time: 12/09/18  9:55 PM   Specimen: BLOOD  Result Value Ref Range Status   Specimen Description BLOOD RIGHT HAND  Final   Special Requests   Final    BOTTLES DRAWN AEROBIC AND ANAEROBIC Blood Culture  results may not be optimal due to an inadequate volume of blood received in culture bottles   Culture   Final    NO GROWTH 5 DAYS Performed at [facility], [address] [place], [place] [address]    Report Status 12/14/2018 FINAL  Final    Sepsis Labs: Invalid input(s): PROCALCITONIN, LACTICIDVEN  Urine analysis:    Component Value Date/Time   COLORURINE YELLOW 05/10/2010 1929   APPEARANCEUR TURBID (A) 05/10/2010 1929   LABSPEC 1.020 05/10/2010 1929   PHURINE 7.0 05/10/2010 1929   GLUCOSEU NEGATIVE 05/10/2010 1929   HGBUR TRACE (A) 05/10/2010 1929   BILIRUBINUR NEGATIVE 05/10/2010 1929   KETONESUR NEGATIVE 05/10/2010 1929   PROTEINUR 30 (A) 05/10/2010 1929   UROBILINOGEN 1.0 05/10/2010 1929   NITRITE POSITIVE (A) 05/10/2010 1929   LEUKOCYTESUR LARGE (A) 05/10/2010 1929    Anemia Panel: No results for input(s): VITAMINB12, FOLATE, FERRITIN, TIBC, IRON, RETICCTPCT in the last 72 hours.  Thyroid Function Tests: No results for input(s): TSH, T4TOTAL, FREET4, T3FREE, THYROIDAB in the last 72 hours.  Lipid Profile: No results for input(s): CHOL, HDL, LDLCALC, TRIG, CHOLHDL, LDLDIRECT in the last 72 hours.  CBG: No results for input(s): GLUCAP in the last 168 hours.  HbA1C: No results for input(s): HGBA1C in the last 72 hours.  BNP (last 3 results): No results for input(s): PROBNP in the last 8760 hours.  Cardiac Enzymes: No  results for input(s): CKTOTAL, CKMB, CKMBINDEX, TROPONINI in the last 168 hours.  Coagulation Profile: No results for input(s): INR, PROTIME in the last 168 hours.  Liver Function Tests: No results for input(s): AST, ALT, ALKPHOS, BILITOT, PROT, ALBUMIN in the last 168 hours. No results for input(s): LIPASE, AMYLASE in the last 168 hours. No results for input(s): AMMONIA in the last 168 hours.  Basic Metabolic Panel: Recent Labs  Lab 12/11/18 0718 12/13/18 0612  NA 137 138  K 4.2 3.7  CL 102 100  CO2 25 28  GLUCOSE 130* 95  BUN 9 <5*  CREATININE 0.53 0.63  CALCIUM 7.8* 8.4*   GFR: Estimated Creatinine Clearance: 79.9 mL/min (by C-G formula based on SCr of 0.63 mg/dL).  CBC: Recent Labs  Lab 12/11/18 0718  WBC 4.7  HGB 10.4*  HCT 32.2*  MCV 91.7  PLT 85*    Procedures:  TEE on 7/8 without vegetation  Microbiology summarized: Blood culture MSSA  Assessment & Plan: MSSA bacteremia: TTE and TEE without vegetation. -Continue Ancef until 7/12, then oral Keflex 500 mg 4 times daily for 2 weeks (delivered to bedside by pharmacy on 7/10). -Long-acting oritavancin IV on discharge should she leave prior to recommended 14 days IV course -Outpatient follow-up with ID. -Appreciate IDs help  Acute transient hypoxemic respiratory failure: Resolved.  No cardiopulmonary symptoms today.  Lung exam reassuring. -Discontinue oxygen -Continue monitoring  Acute transient confusion: Unclear etiology but resolved.  She is on multiple sedating medication which could contribute.  -Continue monitoring  Transient atrial fibrillation: Reported by RN but not documented on CV strip. -Start EKG if this recurs.  Acute respiratory failure with hypoxemia Community-acquired pneumonia -CTA chest negative for PE on admission. -Antibiotic as above -Continue bronchodilators as needed  Left shoulder pain/chronic pain syndrome -MRI left shoulder on 7/5 with moderate subacromial and subdeltoid  bursitis but no other acute finding. -Continue Voltaren gel -Continue gabapentin 800 mg 4 times daily, PRN oxycodone and Robaxin -Appreciate help by orthopedic surgery-attempted subacromial space aspiration but no fluid return.  Injected Kenalog  with lidocaine on 7/10.  Mixed iron deficiency/anemia of chronic disease: Iron saturation 8% -Hemoglobin stable -Continue oral iron supplementation  Thrombocytopenia: Likely due to alcohol although she denies. -Improving -Continue trending  Alcohol use disorder: She denies drinking. -Discontinue CIWA protocol as she is on Xanax. -Continue multivitamin  Substance use disorder: UDS positive for cocaine -Cessation counseling  History of hep C: Quantitative hep C RNA negative suggesting previous exposure and clearance.  HIV negative  Anxiety: On Xanax 1 mg 3 times daily at home -Continue Xanax-narcotic database reviewed. -Add BuSpar  DVT prophylaxis: SCD due to thrombocytopenia Code Status: Full code Family Communication: Patient and RN.  Available if question. Disposition Plan: Remains inpatient for MSSA bacteremia on IV Ancef through 7/12.  Consultants: ID, cardiology, orthopedic surgery  Antimicrobials: Anti-infectives (From admission, onward)   Start     Dose/Rate Route Frequency Ordered Stop   12/19/18 0000  cephALEXin (KEFLEX) 500 MG capsule     500 mg Oral 4 times daily 12/15/18 1253 01/02/19 2359   12/09/18 2000  ceFAZolin (ANCEF) IVPB 2g/100 mL premix     2 g 200 mL/hr over 30 Minutes Intravenous Every 8 hours 12/09/18 1634     12/08/18 2100  cefTRIAXone (ROCEPHIN) 2 g in sodium chloride 0.9 % 100 mL IVPB  Status:  Discontinued     2 g 200 mL/hr over 30 Minutes Intravenous Every 24 hours 12/08/18 2059 12/09/18 1627   12/08/18 2100  azithromycin (ZITHROMAX) 500 mg in sodium chloride 0.9 % 250 mL IVPB  Status:  Discontinued     500 mg 250 mL/hr over 60 Minutes Intravenous Every 24 hours 12/08/18 2059 12/09/18 1627      Sch  Meds:  Scheduled Meds: . busPIRone  7.5 mg Oral TID  . diclofenac sodium  2 g Topical QID  . ferrous sulfate  325 mg Oral BID WC  . folic acid  1 mg Oral Daily  . gabapentin  800 mg Oral QID  . ketorolac  30 mg Intravenous Q6H  . multivitamin with minerals  1 tablet Oral Daily  . senna-docusate  2 tablet Oral BID  . sodium chloride flush  10-40 mL Intracatheter Q12H  . thiamine  100 mg Oral Daily   Continuous Infusions: .  ceFAZolin (ANCEF) IV 2 g (12/16/18 1133)  . dextrose 50 mL/hr at 12/16/18 0932   PRN Meds:.ALPRAZolam, guaiFENesin, methocarbamol, oxyCODONE, sodium chloride flush, traMADol   Taye T. Gonfa [facility]  If 7PM-7AM, please contact night-coverage [email or web address] Password [email or web address] 12/16/2018, 1:58 PM

## 2018-12-16 NOTE — Progress Notes (Signed)
Occupational Therapy Treatment Patient Details Name: [name] MRN: [id number] DOB: [birth year] Today's Date: 12/16/2018    History of present illness Arlin Murriel Holwerda is a 48 y.o. female was transferred up from [facility] because of fever hypoxia and right upper lobe infiltrate seen on CT scan.  COVID testing there and after admission here was negative.  Admission blood cultures here are growing MSSA and 1 set.  She has a history of IVDU and hepatitis C.   OT comments  Pt received injection to L shoulder earlier today for pain. Focus of session on L shoulder exercises within pain tolerance. Pt able to demonstrate understanding and will continue x 3 per day in addition to using L UE functionally during ADL.   Follow Up Recommendations  Outpatient OT    Equipment Recommendations  None recommended by OT    Recommendations for Other Services      Precautions / Restrictions Precautions Precautions: None       Mobility Bed Mobility Overal bed mobility: Independent                Transfers Overall transfer level: Independent Equipment used: None                  Balance     Sitting balance-Leahy Scale: Normal       Standing balance-Leahy Scale: Good Standing balance comment: no LOB during pendulums, cued pt to hold on to bed rail                           ADL either performed or assessed with clinical judgement   ADL                                               Vision       Perception     Praxis      Cognition Arousal/Alertness: Awake/alert Behavior During Therapy: WFL for tasks assessed/performed Overall Cognitive Status: Within Functional Limits for tasks assessed                                          Exercises General Exercises - Upper Extremity Shoulder Flexion: AAROM;Left;10 reps;Supine Other Exercises Other Exercises: towel exercises on overbed table with L  UE Other Exercises: pendulum exercises in standing--L UE   Shoulder Instructions       General Comments      Pertinent Vitals/ Pain       Pain Assessment: Faces Faces Pain Scale: Hurts whole lot Pain Location: L shoulder with exercise Pain Descriptors / Indicators: Discomfort;Grimacing;Guarding Pain Intervention(s): Monitored during session;Repositioned;Patient requesting pain meds-RN notified  Home Living                                          Prior Functioning/Environment              Frequency  Min 2X/week        Progress Toward Goals  OT Goals(current goals can now be found in the care plan section)  Progress towards OT goals: Progressing toward goals  Acute Rehab OT Goals Patient Stated Goal: home by  Sunday OT Goal Formulation: With patient Time For Goal Achievement: 12/25/18 Potential to Achieve Goals: Good  Plan Discharge plan remains appropriate    Co-evaluation                 AM-PAC OT "6 Clicks" Daily Activity     Outcome Measure   Help from another person eating meals?: None Help from another person taking care of personal grooming?: None Help from another person toileting, which includes using toliet, bedpan, or urinal?: None Help from another person bathing (including washing, rinsing, drying)?: A Little Help from another person to put on and taking off regular upper body clothing?: A Little Help from another person to put on and taking off regular lower body clothing?: A Little 6 Click Score: 21    End of Session    OT Visit Diagnosis: Muscle weakness (generalized) (M62.81);Pain Pain - Right/Left: Left Pain - part of body: Shoulder   Activity Tolerance Patient tolerated treatment well   Patient Left in bed;with call bell/phone within reach;with bed alarm set   Nurse Communication          Time: [id number] OT Time Calculation (min): 25 min  Charges: OT General Charges $OT Visit: 1 Visit OT  Treatments $Therapeutic Exercise: 23-37 mins  Martie RoundJulie Asiana Benninger, OTR/L Acute Rehabilitation Services Pager: [phone] Office: [phone]   Evern BioMayberry, Katori Wirsing Lynn 12/16/2018, 3:29 PM

## 2018-12-16 NOTE — Progress Notes (Signed)
Ortho Note  Procedure: Timeout performed. Verbal consent was obtained. Posterior shoulder cleansed and prepped. Subacromial space was aspirated but no fluid returned. Injected mixture of 40mg  kenalog with 26ml of 1% lidocaine. Patient tolerated the procedure well.  Shona Needles, MD [facility] [phone] (phone) [phone] (office) [email or web address]

## 2018-12-16 NOTE — Progress Notes (Signed)
ID Pharmacy Note    A 2 week supply of cephalexin 500 mg QID has been delivered to [name]'s  room by [facility] in preparation for discharge Sunday.   Jimmy Footman, PharmD, BCPS, BCIDP Infectious Diseases Clinical Pharmacist Phone: [phone] 12/16/2018 9:54 AM

## 2018-12-16 NOTE — Progress Notes (Signed)
Physical Therapy Treatment Patient Details Name: [name] MRN: [id number] DOB: [birth year] Today's Date: 12/16/2018    History of Present Illness [name] is a 48 y.o. female was transferred up from [facility] yesterday because of fever hypoxia and right upper lobe infiltrate seen on CT scan.  COVID testing there and after admission here was negative.  Admission blood cultures here are growing MSSA and 1 set.  She has a history of IVDU and hepatitis C.    PT Comments    Pt able to maintain SpO2 >90% on RA with amb.    Follow Up Recommendations  No PT follow up     Equipment Recommendations  None recommended by PT    Recommendations for Other Services       Precautions / Restrictions Precautions Precautions: None Restrictions Weight Bearing Restrictions: No    Mobility  Bed Mobility Overal bed mobility: Independent                Transfers Overall transfer level: Independent Equipment used: None Transfers: Sit to/from Stand Sit to Stand: Independent            Ambulation/Gait Ambulation/Gait assistance: Supervision Gait Distance (Feet): 300 Feet Assistive device: None Gait Pattern/deviations: Step-through pattern;Staggering left Gait velocity: decr Gait velocity interpretation: >2.62 ft/sec, indicative of community ambulatory General Gait Details: Pt with several staggers when distracted but able to recover without assist. Amb on RA with SpO2 >90%.   Stairs             Wheelchair Mobility    Modified Rankin (Stroke Patients Only)       Balance Overall balance assessment: Mild deficits observed, not formally tested   Sitting balance-Leahy Scale: Normal       Standing balance-Leahy Scale: Good Standing balance comment: no LOB during pendulums, cued pt to hold on to bed rail                            Cognition Arousal/Alertness: Awake/alert Behavior During Therapy: WFL for tasks  assessed/performed Overall Cognitive Status: Within Functional Limits for tasks assessed                                        Exercises General Exercises - Upper Extremity Shoulder Flexion: AAROM;Left;10 reps;Supine Other Exercises Other Exercises: towel exercises on overbed table with L UE Other Exercises: pendulum exercises in standing--L UE    General Comments        Pertinent Vitals/Pain Pain Assessment: Faces Faces Pain Scale: Hurts little more Pain Location: lt shoulder with general mobility Pain Descriptors / Indicators: Discomfort;Grimacing;Guarding Pain Intervention(s): Monitored during session;Limited activity within patient's tolerance    Home Living                      Prior Function            PT Goals (current goals can now be found in the care plan section) Acute Rehab PT Goals Patient Stated Goal: go home sunday Progress towards PT goals: Progressing toward goals    Frequency    Min 3X/week      PT Plan Current plan remains appropriate    Co-evaluation              AM-PAC PT "6 Clicks" Mobility   Outcome Measure  Help needed turning from  your back to your side while in a flat bed without using bedrails?: None Help needed moving from lying on your back to sitting on the side of a flat bed without using bedrails?: None Help needed moving to and from a bed to a chair (including a wheelchair)?: None Help needed standing up from a chair using your arms (e.g., wheelchair or bedside chair)?: None Help needed to walk in hospital room?: None Help needed climbing 3-5 steps with a railing? : A Little 6 Click Score: 23    End of Session   Activity Tolerance: Patient tolerated treatment well Patient left: in bed;with call bell/phone within reach   PT Visit Diagnosis: Other abnormalities of gait and mobility (R26.89)     Time: [id number] PT Time Calculation (min) (ACUTE ONLY): 12 min  Charges:  $Gait Training:  8-22 mins                     [facility] PT Acute Rehabilitation Services Pager [phone] Office [phone]    Angelina [facility] 12/16/2018, 4:05 PM

## 2018-12-17 NOTE — Progress Notes (Signed)
PROGRESS NOTE  [name] ZOX:[id number] DOB: Oct 21, 1970   PCP: No primary care provider on file.  Patient is from: Home  DOA: 12/08/2018 LOS: 9  Brief Narrative / Interim history: 48 year old female with history of IVDU and emphysema who [facility] failure and hypoxemia and found to have MSSA bacteremia.  TTE and TEE without vegetation.  Subjective: No major events overnight of this morning.  Left shoulder pain improved after steroid injection yesterday.  Also some improvement in range of motion.  Denies chest pain, dyspnea, GI or GU symptoms.  Objective: Vitals:   12/16/18 2242 12/17/18 0300 12/17/18 0745 12/17/18 1203  BP: 118/82 (!) 133/92 120/86 113/61  Pulse: 92 88 96 91  Resp: 18 18 18 18   Temp: 98.5 F (36.9 C) 97.6 F (36.4 C) 98.7 F (37.1 C) 98.6 F (37 C)  TempSrc: Oral Oral  Oral  SpO2: 92% 94% 95% 95%  Weight:      Height:        Intake/Output Summary (Last 24 hours) at 12/17/2018 1316 Last data filed at 12/17/2018 0940 Gross per 24 hour  Intake 1140 ml  Output 1700 ml  Net -560 ml   Filed Weights   12/08/18 2000 12/08/18 2030 12/13/18 0736  Weight: 61.7 kg 61 kg 61 kg    Examination:  GENERAL: No acute distress.  Appears well.  HEENT: MMM.  Vision and hearing grossly intact.  Poor dentition. LUNGS:  No IWOB. Good air movement bilaterally. HEART:  RRR. Heart sounds normal.  ABD: Bowel sounds present. Soft. Non tender.  MSK/EXT: Limited ROM in left shoulder.  No swelling or erythema.  Tenderness to palpation anteriorly and superiorly. SKIN: no apparent skin lesion or wound NEURO: Awake, alert and oriented appropriately.  No gross deficit.  PSYCH: Calm. Normal affect..  I have personally reviewed the following labs and images:  Radiology Studies: No results found.  Microbiology: Recent Results (from the past 240 hour(s))  Culture, blood (routine x 2) Call MD if unable to obtain prior to antibiotics being  given     Status: Abnormal   Collection Time: 12/08/18 10:20 PM   Specimen: BLOOD LEFT HAND  Result Value Ref Range Status   Specimen Description BLOOD LEFT HAND  Final   Special Requests   Final    BOTTLES DRAWN AEROBIC ONLY Blood Culture results may not be optimal due to an inadequate volume of blood received in culture bottles   Culture  Setup Time   Final    GRAM POSITIVE COCCI IN CLUSTERS AEROBIC BOTTLE ONLY CRITICAL RESULT CALLED TO, READ BACK BY AND VERIFIED WITH: Hessie Diener. Dang PharmD 16:50 12/09/18 ([email or web address]) Performed at [facility], [address] [place], [place] [address]    Culture STAPHYLOCOCCUS AUREUS (A)  Final   Report Status 12/11/2018 FINAL  Final   Organism ID, Bacteria STAPHYLOCOCCUS AUREUS  Final      Susceptibility   Staphylococcus aureus - MIC*    CIPROFLOXACIN <=0.5 SENSITIVE Sensitive     ERYTHROMYCIN >=8 RESISTANT Resistant     GENTAMICIN <=0.5 SENSITIVE Sensitive     OXACILLIN <=0.25 SENSITIVE Sensitive     TETRACYCLINE <=1 SENSITIVE Sensitive     VANCOMYCIN 1 SENSITIVE Sensitive     TRIMETH/SULFA <=10 SENSITIVE Sensitive     CLINDAMYCIN <=0.25 SENSITIVE Sensitive     RIFAMPIN <=0.5 SENSITIVE Sensitive     Inducible Clindamycin NEGATIVE Sensitive     * STAPHYLOCOCCUS AUREUS  Culture, blood (routine x  2) Call MD if unable to obtain prior to antibiotics being given     Status: None   Collection Time: 12/08/18 10:20 PM   Specimen: BLOOD RIGHT ARM  Result Value Ref Range Status   Specimen Description BLOOD RIGHT ARM  Final   Special Requests   Final    BOTTLES DRAWN AEROBIC ONLY Blood Culture results may not be optimal due to an inadequate volume of blood received in culture bottles   Culture   Final    NO GROWTH 5 DAYS Performed at [facility], [place] [address] [place], [place] [address]    Report Status 12/13/2018 FINAL  Final  Blood Culture ID Panel (Reflexed)     Status: Abnormal   Collection Time: 12/08/18 10:20 PM  Result Value Ref Range  Status   Enterococcus species NOT DETECTED NOT DETECTED Final   Listeria monocytogenes NOT DETECTED NOT DETECTED Final   Staphylococcus species DETECTED (A) NOT DETECTED Final    Comment: CRITICAL RESULT CALLED TO, READ BACK BY AND VERIFIED WITH: Diona Browner PharmD 15:50 12/09/18 ([email or web address])    Staphylococcus aureus (BCID) DETECTED (A) NOT DETECTED Final    Comment: Methicillin (oxacillin) susceptible Staphylococcus aureus (MSSA). Preferred therapy is anti staphylococcal beta lactam antibiotic (Cefazolin or Nafcillin), unless clinically contraindicated. CRITICAL RESULT CALLED TO, READ BACK BY AND VERIFIED WITH: Diona Browner PharmD 15:50 12/09/18 ([email or web address])    Methicillin resistance NOT DETECTED NOT DETECTED Final   Streptococcus species NOT DETECTED NOT DETECTED Final   Streptococcus agalactiae NOT DETECTED NOT DETECTED Final   Streptococcus pneumoniae NOT DETECTED NOT DETECTED Final   Streptococcus pyogenes NOT DETECTED NOT DETECTED Final   Acinetobacter baumannii NOT DETECTED NOT DETECTED Final   Enterobacteriaceae species NOT DETECTED NOT DETECTED Final   Enterobacter cloacae complex NOT DETECTED NOT DETECTED Final   Escherichia coli NOT DETECTED NOT DETECTED Final   Klebsiella oxytoca NOT DETECTED NOT DETECTED Final   Klebsiella pneumoniae NOT DETECTED NOT DETECTED Final   Proteus species NOT DETECTED NOT DETECTED Final   Serratia marcescens NOT DETECTED NOT DETECTED Final   Haemophilus influenzae NOT DETECTED NOT DETECTED Final   Neisseria meningitidis NOT DETECTED NOT DETECTED Final   Pseudomonas aeruginosa NOT DETECTED NOT DETECTED Final   Candida albicans NOT DETECTED NOT DETECTED Final   Candida glabrata NOT DETECTED NOT DETECTED Final   Candida krusei NOT DETECTED NOT DETECTED Final   Candida parapsilosis NOT DETECTED NOT DETECTED Final   Candida tropicalis NOT DETECTED NOT DETECTED Final    Comment: Performed at [facility], [place] [address], [place], [place] [address]  SARS  Coronavirus 2 (CEPHEID- Performed in [place] [facility] lab), Hosp Order     Status: None   Collection Time: 12/08/18 11:45 PM   Specimen: Nasopharyngeal Swab  Result Value Ref Range Status   SARS Coronavirus 2 NEGATIVE NEGATIVE Final    Comment: (NOTE) If result is NEGATIVE SARS-CoV-2 target nucleic acids are NOT DETECTED. The SARS-CoV-2 RNA is generally detectable in upper and lower  respiratory specimens during the acute phase of infection. The lowest  concentration of SARS-CoV-2 viral copies this assay can detect is 250  copies / mL. A negative result does not preclude SARS-CoV-2 infection  and should not be used as the sole basis for treatment or other  patient management decisions.  A negative result may occur with  improper specimen collection / handling, submission of specimen other  than nasopharyngeal swab, presence of viral mutation(s) within the  areas targeted  by this assay, and inadequate number of viral copies  (<250 copies / mL). A negative result must be combined with clinical  observations, patient history, and epidemiological information. If result is POSITIVE SARS-CoV-2 target nucleic acids are DETECTED. The SARS-CoV-2 RNA is generally detectable in upper and lower  respiratory specimens dur ing the acute phase of infection.  Positive  results are indicative of active infection with SARS-CoV-2.  Clinical  correlation with patient history and other diagnostic information is  necessary to determine patient infection status.  Positive results do  not rule out bacterial infection or co-infection with other viruses. If result is PRESUMPTIVE POSTIVE SARS-CoV-2 nucleic acids MAY BE PRESENT.   A presumptive positive result was obtained on the submitted specimen  and confirmed on repeat testing.  While 2019 novel coronavirus  (SARS-CoV-2) nucleic acids may be present in the submitted sample  additional confirmatory testing may be necessary for epidemiological  and / or  clinical management purposes  to differentiate between  SARS-CoV-2 and other Sarbecovirus currently known to infect humans.  If clinically indicated additional testing with an alternate test  methodology [id number]) is advised. The SARS-CoV-2 RNA is generally  detectable in upper and lower respiratory sp ecimens during the acute  phase of infection. The expected result is Negative. Fact Sheet for Patients:  BoilerBrush.com.cyhttps://www.fda.gov/media/136312/download Fact Sheet for Healthcare Providers: https://pope.com/https://www.fda.gov/media/136313/download This test is not yet approved or cleared by the [place] FDA and has been authorized for detection and/or diagnosis of SARS-CoV-2 by FDA under an Emergency Use Authorization (EUA).  This EUA will remain in effect (meaning this test can be used) for the duration of the COVID-19 declaration under Section 564(b)(1) of the Act, 21 U.S.C. section 360bbb-3(b)(1), unless the authorization is terminated or revoked sooner. Performed at [facility], [address] [place], [place] [address]   MRSA PCR Screening     Status: None   Collection Time: 12/09/18 12:10 AM   Specimen: Nasopharyngeal  Result Value Ref Range Status   MRSA by PCR NEGATIVE NEGATIVE Final    Comment:        The GeneXpert MRSA Assay (FDA approved for NASAL specimens only), is one component of a comprehensive MRSA colonization surveillance program. It is not intended to diagnose MRSA infection nor to guide or monitor treatment for MRSA infections. Performed at [facility], [address] [place], [place] [address]   Culture, blood (routine x 2)     Status: None   Collection Time: 12/09/18  9:47 PM   Specimen: BLOOD  Result Value Ref Range Status   Specimen Description BLOOD LEFT HAND  Final   Special Requests   Final    BOTTLES DRAWN AEROBIC ONLY Blood Culture results may not be optimal due to an inadequate volume of blood received in culture bottles   Culture   Final     NO GROWTH 5 DAYS Performed at [facility], [address] [place], [place] [address]    Report Status 12/14/2018 FINAL  Final  Culture, blood (routine x 2)     Status: None   Collection Time: 12/09/18  9:55 PM   Specimen: BLOOD  Result Value Ref Range Status   Specimen Description BLOOD RIGHT HAND  Final   Special Requests   Final    BOTTLES DRAWN AEROBIC AND ANAEROBIC Blood Culture results may not be optimal due to an inadequate volume of blood received in culture bottles   Culture   Final  NO GROWTH 5 DAYS Performed at [facility], [address] [place], [place] [address]    Report Status 12/14/2018 FINAL  Final    Sepsis Labs: Invalid input(s): PROCALCITONIN, LACTICIDVEN  Urine analysis:    Component Value Date/Time   COLORURINE YELLOW 05/10/2010 1929   APPEARANCEUR TURBID (A) 05/10/2010 1929   LABSPEC 1.020 05/10/2010 1929   PHURINE 7.0 05/10/2010 1929   GLUCOSEU NEGATIVE 05/10/2010 1929   HGBUR TRACE (A) 05/10/2010 1929   BILIRUBINUR NEGATIVE 05/10/2010 1929   KETONESUR NEGATIVE 05/10/2010 1929   PROTEINUR 30 (A) 05/10/2010 1929   UROBILINOGEN 1.0 05/10/2010 1929   NITRITE POSITIVE (A) 05/10/2010 1929   LEUKOCYTESUR LARGE (A) 05/10/2010 1929    Anemia Panel: No results for input(s): VITAMINB12, FOLATE, FERRITIN, TIBC, IRON, RETICCTPCT in the last 72 hours.  Thyroid Function Tests: No results for input(s): TSH, T4TOTAL, FREET4, T3FREE, THYROIDAB in the last 72 hours.  Lipid Profile: No results for input(s): CHOL, HDL, LDLCALC, TRIG, CHOLHDL, LDLDIRECT in the last 72 hours.  CBG: No results for input(s): GLUCAP in the last 168 hours.  HbA1C: No results for input(s): HGBA1C in the last 72 hours.  BNP (last 3 results): No results for input(s): PROBNP in the last 8760 hours.  Cardiac Enzymes: No results for input(s): CKTOTAL, CKMB, CKMBINDEX, TROPONINI in the last 168 hours.  Coagulation Profile: No results for input(s): INR, PROTIME in  the last 168 hours.  Liver Function Tests: No results for input(s): AST, ALT, ALKPHOS, BILITOT, PROT, ALBUMIN in the last 168 hours. No results for input(s): LIPASE, AMYLASE in the last 168 hours. No results for input(s): AMMONIA in the last 168 hours.  Basic Metabolic Panel: Recent Labs  Lab 12/11/18 0718 12/13/18 0612  NA 137 138  K 4.2 3.7  CL 102 100  CO2 25 28  GLUCOSE 130* 95  BUN 9 <5*  CREATININE 0.53 0.63  CALCIUM 7.8* 8.4*   GFR: Estimated Creatinine Clearance: 79.9 mL/min (by C-G formula based on SCr of 0.63 mg/dL).  CBC: Recent Labs  Lab 12/11/18 0718  WBC 4.7  HGB 10.4*  HCT 32.2*  MCV 91.7  PLT 85*    Procedures:  TEE on 7/8 without vegetation  Microbiology summarized: Blood culture MSSA  Assessment & Plan: MSSA bacteremia: TTE and TEE without vegetation. -Continue Ancef until 7/12, then oral Keflex 500 mg 4 times daily for 2 weeks (delivered to bedside by pharmacy on 7/10). -Long-acting oritavancin IV on discharge should she leave prior to recommended 14 days IV course -Outpatient follow-up with ID. -Appreciate ID-signed off  Acute transient hypoxemic respiratory failure/acute transient confusion: Resolved. -We follow closely while on multiple sedating medication for pain and mood disorder. -Antibiotic as above -PRN breathing treatments  Transient atrial fibrillation: Reported by RN on 7/9 but not documented on CV strip. -Stat EKG if this recurs.  Left shoulder pain/chronic pain syndrome: MRI on 7/5 concerning for subacromial and subdeltoid bursitis.  Improved. -Ortho attempted subacromial space aspiration but no fluid return.  Injected Kenalog with lidocaine on 7/10. -Continue Voltaren gel -Continue gabapentin 800 mg 4 times daily, PRN oxycodone and Robaxin -Encourage range of motion -PT  Mixed iron deficiency/anemia of chronic disease: Iron saturation 8% -Hemoglobin stable -Continue oral iron supplementation  Thrombocytopenia: Likely  due to alcohol although she denies. -Improving  Alcohol use disorder: She denies drinking. -Discontinue CIWA protocol as she is on Xanax. -Continue multivitamin  Substance use disorder: UDS positive for cocaine on presentation -Encouraged to stop  History of hep C: Quantitative hep C RNA negative suggesting previous exposure and clearance.  HIV negative  Anxiety: On Xanax 1 mg 3 times daily at home -Continue Xanax-narcotic database reviewed. -Add BuSpar  DVT prophylaxis: SCD due to thrombocytopenia Code Status: Full code Family Communication: Patient and RN.  Available if question. Disposition Plan: Remains inpatient for MSSA bacteremia on IV Ancef through 7/12.  Consultants: ID, cardiology, orthopedic surgery  Antimicrobials: Anti-infectives (From admission, onward)   Start     Dose/Rate Route Frequency Ordered Stop   12/19/18 0000  cephALEXin (KEFLEX) 500 MG capsule     500 mg Oral 4 times daily 12/15/18 1253 01/02/19 2359   12/09/18 2000  ceFAZolin (ANCEF) IVPB 2g/100 mL premix     2 g 200 mL/hr over 30 Minutes Intravenous Every 8 hours 12/09/18 1634     12/08/18 2100  cefTRIAXone (ROCEPHIN) 2 g in sodium chloride 0.9 % 100 mL IVPB  Status:  Discontinued     2 g 200 mL/hr over 30 Minutes Intravenous Every 24 hours 12/08/18 2059 12/09/18 1627   12/08/18 2100  azithromycin (ZITHROMAX) 500 mg in sodium chloride 0.9 % 250 mL IVPB  Status:  Discontinued     500 mg 250 mL/hr over 60 Minutes Intravenous Every 24 hours 12/08/18 2059 12/09/18 1627      Sch Meds:  Scheduled Meds: . busPIRone  7.5 mg Oral TID  . diclofenac sodium  2 g Topical QID  . ferrous sulfate  325 mg Oral BID WC  . folic acid  1 mg Oral Daily  . gabapentin  800 mg Oral QID  . ketorolac  15 mg Intravenous Q6H  . multivitamin with minerals  1 tablet Oral Daily  . senna-docusate  2 tablet Oral BID  . sodium chloride flush  10-40 mL Intracatheter Q12H  . thiamine  100 mg Oral Daily   Continuous  Infusions: .  ceFAZolin (ANCEF) IV 2 g (12/17/18 1209)   PRN Meds:.ALPRAZolam, guaiFENesin, methocarbamol, oxyCODONE, sodium chloride flush, traMADol    T.  [facility]  If 7PM-7AM, please contact night-coverage [email or web address] Password [email or web address] 12/17/2018, 1:16 PM

## 2018-12-17 NOTE — Progress Notes (Signed)
Pt calls out asking "for any medications I can have" This nurse went to ask pt what symptoms she was having to clarify what prn med pt was needing. Pt states "I want anything that is due" This nurse tells pt that she had no scheduled medication due. Pt states "I take so many medications for pain, I never know what I can have" Pt reports pain 10/10 to left shoulder. Oxycodone and robaxin given.

## 2018-12-18 ENCOUNTER — Inpatient Hospital Stay (HOSPITAL_COMMUNITY): Payer: Medicaid Other

## 2018-12-18 DIAGNOSIS — K219 Gastro-esophageal reflux disease without esophagitis: Secondary | ICD-10-CM

## 2018-12-18 LAB — CBC
HCT: 36.2 % (ref 36.0–46.0)
Hemoglobin: 11.4 g/dL — ABNORMAL LOW (ref 12.0–15.0)
MCH: 29.5 pg (ref 26.0–34.0)
MCHC: 31.5 g/dL (ref 30.0–36.0)
MCV: 93.8 fL (ref 80.0–100.0)
Platelets: 235 10*3/uL (ref 150–400)
RBC: 3.86 MIL/uL — ABNORMAL LOW (ref 3.87–5.11)
RDW: 13.5 % (ref 11.5–15.5)
WBC: 6.8 10*3/uL (ref 4.0–10.5)
nRBC: 0 % (ref 0.0–0.2)

## 2018-12-18 MED ORDER — PANTOPRAZOLE SODIUM 40 MG PO TBEC: 40.0000 mg | DELAYED_RELEASE_TABLET | Freq: Every day | ORAL | 0 refills | Status: AC

## 2018-12-18 MED ORDER — BUSPIRONE HCL 7.5 MG PO TABS: 7.5000 mg | ORAL_TABLET | Freq: Three times a day (TID) | ORAL | 1 refills | Status: AC

## 2018-12-18 MED ORDER — PANTOPRAZOLE SODIUM 40 MG PO TBEC: 40.0000 mg | DELAYED_RELEASE_TABLET | Freq: Every day | ORAL | Status: DC

## 2018-12-18 MED ORDER — FERROUS SULFATE 325 (65 FE) MG PO TABS: 325.0000 mg | ORAL_TABLET | Freq: Two times a day (BID) | ORAL | 1 refills | Status: AC

## 2018-12-18 NOTE — Discharge Summary (Signed)
Physician Discharge Summary  [name] DEY:[id number] DOB: [birth year] DOA: 12/08/2018  PCP: No primary care provider on file.  Admit date: 12/08/2018 Discharge date: 12/18/2018  Admitted From: Home Disposition: Home  Recommendations for Outpatient Follow-up:  1. Follow up with PCP and infectious disease as scheduled. 2. Please obtain CBC/CRP/ESR at follow up 3. Please follow up on the following pending results: None  Home Health: None Equipment/Devices: None  Discharge Condition: Stable CODE STATUS: Full code   Hospital Course: 48 year old female with history of IVDU, chronic pain syndrome, alcohol use, anxiety, tobacco use and emphysema who presented to [facility] with acute respiratory failure and hypoxemia and found to have MSSA bacteremia.  TTE and TEE without vegetation.  Treated with IV Ancef and discharged on oral Keflex per ID recommendations.  See individual problem list below for more.  Discharge Diagnoses:  MSSA bacteremia: TTE and TEE without vegetation. -Ceftriaxone and azithromycin 7/2-7/3 at [place] 7/3-7/12, then oral Keflex 500 mg 4 times daily for 2 weeks (delivered to bedside by pharmacy on 7/10) per ID recommendation.  Patient was treated with short course of Ancef to return home and be with her small child. -Outpatient follow-up with ID. -Appreciate ID-signed off  Acute transient hypoxemic respiratory failure/acute transient confusion: Resolved. -Antibiotics as above. -Two-view CXR on the day of discharge without acute finding.  Transient atrial fibrillation: Reported by RN on 7/9 but not documented on CV strip.  No EKG obtained.  No further events.  Left shoulder pain/chronic pain syndrome: MRI on 7/5 concerning for subacromial and subdeltoid bursitis.  Improved. -Ortho attempted subacromial space aspiration but no fluid return.  Injected Kenalog with lidocaine on 7/10. -Discharged on home oxycodone. -Encourage range of  motion -Follow-up with PCP or orthopedic surgery  Mixed iron deficiency/anemia of chronic disease: Iron saturation 8%.  Hemoglobin improved here, 11.4 on discharge. -Hemoglobin stable.  Discharged on p.o. iron. -Recheck CBC at follow-up  Thrombocytopenia: Likely due to alcohol although she denies.  Resolved.  Alcohol use disorder: She denies drinking. -Discontinue CIWA protocol as she is on Xanax. -Continue multivitamin  Substance use disorder: UDS positive for cocaine on presentation.  Reportedly positive for amphetamines at [facility] -Encouraged to stop.   -She regularly gets Rx for oxycodone and Xanax as well.  Strongly recommend weaning her from these.  History of hep C: Quantitative hep C RNA negative suggesting previous exposure and clearance.  HIV negative  Anxiety: On Xanax 1 mg 3 times daily at home -[place] reviewed.  Declined Klonopin or other safer anxiety medications. -Strongly recommend weaning the from Xanax -Added BuSpar  GERD -Discharged on Protonix.  Discharge Instructions  Discharge Instructions    Call MD for:  extreme fatigue   Complete by: As directed    Call MD for:  persistant dizziness or light-headedness   Complete by: As directed    Call MD for:  redness, tenderness, or signs of infection (pain, swelling, redness, odor or green/yellow discharge around incision site)   Complete by: As directed    Call MD for:  temperature >100.4   Complete by: As directed    Diet general   Complete by: As directed    Discharge instructions   Complete by: As directed    It has been a pleasure taking care of you! You were admitted with blood stream infection.  You were treated with IV antibiotics.  We are discharging you on oral antibiotics to complete the treatment course.  It is  very important that you continue taking the antibiotics as prescribed and follow-up with your primary care doctor and infectious disease expert.    Please review your new medication list and the directions before you take your medications.  Take care,   Increase activity slowly   Complete by: As directed      Allergies as of 12/18/2018      Reactions   Morphine And Related Hives   Zofran [ondansetron Hcl] Other (See Comments)   Pt said it made her really sick      Medication List    STOP taking these medications   metoprolol succinate 50 MG 24 hr tablet Commonly known as: TOPROL-XL     TAKE these medications   ALPRAZolam 1 MG tablet Commonly known as: XANAX Take 1 mg by mouth 3 (three) times daily.   busPIRone 7.5 MG tablet Commonly known as: BUSPAR Take 1 tablet (7.5 mg total) by mouth 3 (three) times daily.   cephALEXin 500 MG capsule Commonly known as: KEFLEX Take 1 capsule (500 mg total) by mouth 4 (four) times daily for 14 days. Start taking on: December 19, 2018   ferrous sulfate 325 (65 FE) MG tablet Take 1 tablet (325 mg total) by mouth 2 (two) times daily with a meal.   gabapentin 800 MG tablet Commonly known as: NEURONTIN Take 800 mg by mouth 4 (four) times daily.   oxyCODONE 15 MG immediate release tablet Commonly known as: ROXICODONE Take 15 mg by mouth every 6 (six) hours as needed for pain.   pantoprazole 40 MG tablet Commonly known as: PROTONIX Take 1 tablet (40 mg total) by mouth daily.      Follow-up Information    Harvie Junior, MD Follow up.   Specialty: Family Medicine Why: please call to make follow up apt Contact information: [place] [address] [phone]        [facility]ious Disease Follow up on 01/03/2019.   Specialty: Infectious Diseases Why: Appointment with Colletta Maryland, NP at 1:45 pm. Please arrive 15 min early to check in.  Contact information: [place], [place] [id number] [place] [phone]          Consultations:  Infectious disease  Orthopedic  surgery  Cardiology  Procedures/Studies: 2D Echo (TTE) on 12/10/2018 1. The left ventricle has normal systolic function, with an ejection fraction of 55-60%. The cavity size was normal. Left ventricular diastolic parameters were normal.  2. The right ventricle has normal systolic function. The cavity was normal. There is no increase in right ventricular wall thickness.  3. The inferior vena cava was dilated in size with <50% respiratory variability.  TEE on 12/14/2018 1. The left ventricle has normal systolic function, with an ejection fraction of 60-65%. The cavity size was normal.  2. Trivial pericardial effusion is present.  3. No mitral valve vegetation visualized.  4. No trisuspid valve vegetation visualized.  5. No stenosis of the aortic valve.  6. No vegetation on the aortic valve.  7. The aortic root and ascending aorta are normal in size and structure.  Dg Chest 2 View  Result Date: 12/18/2018 CLINICAL DATA:  history of IVDU and emphysema who presented to [facility] with acute respiratory failure and hypoxemia and found to have MSSA bacteremia. EXAM: CHEST - 2 VIEW COMPARISON:  12/09/2018 FINDINGS: Cardiac silhouette is normal size. No mediastinal or hilar masses. No evidence of adenopathy. Mildly prominent interstitial markings in the lower lungs,  stable. Lungs otherwise clear. No pleural effusion or pneumothorax. Skeletal structures are unremarkable. IMPRESSION: No active cardiopulmonary disease. Electronically Signed   By: Lajean Manes M.D.   On: 12/18/2018 09:58   Mr Shoulder Left Wo Contrast  Result Date: 12/11/2018 CLINICAL DATA:  Multiple falls.  Left shoulder pain. EXAM: MRI OF THE LEFT SHOULDER WITHOUT CONTRAST TECHNIQUE: Multiplanar, multisequence MR imaging of the shoulder was performed. No intravenous contrast was administered. COMPARISON:  Radiographs 12/09/2018 FINDINGS: Rotator cuff: Normal rotator cuff tendons. No significant tendinopathy and no partial or full-thickness tear.  Muscles:  Normal Biceps long head:  Intact Acromioclavicular Joint: No degenerative changes. Type 2-3 acromion. No lateral downsloping or undersurface spurring. Glenohumeral Joint: Normal articular cartilage.  No joint effusion. Labrum:  No definite labral tears. Bones:  No acute bony findings. Other: Moderate subacromial/subdeltoid bursitis. IMPRESSION: 1. Intact rotator cuff tendons. 2. No acute bony findings. 3. Intact long head biceps tendon and glenoid labrum. 4. Moderate subacromial/subdeltoid bursitis. Electronically Signed   By: Marijo Sanes M.D.   On: 12/11/2018 13:25   Dg Chest Port 1 View  Result Date: 12/09/2018 CLINICAL DATA:  Acute on chronic respiratory failure. EXAM: PORTABLE CHEST 1 VIEW COMPARISON:  None. FINDINGS: The heart size and mediastinal contours are within normal limits. Normal pulmonary vascularity. Mildly coarsened interstitial markings are likely smoking-related. No focal consolidation, pleural effusion, or pneumothorax. No acute osseous abnormality. IMPRESSION: No active disease. Electronically Signed   By: Titus Dubin M.D.   On: 12/09/2018 12:19   Dg Shoulder Left  Result Date: 12/09/2018 CLINICAL DATA:  Left shoulder pain after multiple falls this week. EXAM: LEFT SHOULDER - 2+ VIEW COMPARISON:  None. FINDINGS: There is no evidence of fracture or dislocation. There is no evidence of arthropathy or other focal bone abnormality. Soft tissues are unremarkable. IMPRESSION: Negative. Electronically Signed   By: Marijo Conception M.D.   On: 12/09/2018 13:43      Subjective: No major events overnight of this morning.  Reports cough with greenish phlegm.  No dyspnea or chest pain.  Denies palpitation or dizziness.  No GI or GU symptoms.  Reports postnasal drainage and reflux.  Two view CXR this morning without acute finding.   Discharge Exam: Vitals:   12/18/18 0350 12/18/18 0814  BP: 137/85 127/89  Pulse:    Resp:  16  Temp: 97.9 F (36.6 C) 98.4 F (36.9 C)   SpO2: 98% 96%    GENERAL: No acute distress.  Appears well.  HEENT: MMM.  Poor dentition.  Vision and hearing grossly intact.  NECK: Supple.  No apparent JVD. LUNGS:  No IWOB. Good air movement bilaterally.  Rhonchi but no crackles or wheeze. HEART:  RRR. Heart sounds normal.  No murmurs. ABD: Bowel sounds present. Soft. Non tender.  MSK/EXT: Limited ROM in left shoulder.  No swelling or erythema.  Tenderness to palpation anteriorly and superiorly. SKIN: no apparent skin lesion or wound NEURO: Awake, alert and oriented appropriately.  No gross deficit.  PSYCH: Calm. Normal affect.     The results of significant diagnostics from this hospitalization (including imaging, microbiology, ancillary and laboratory) are listed below for reference.     Microbiology: Recent Results (from the past 240 hour(s))  Culture, blood (routine x 2) Call MD if unable to obtain prior to antibiotics being given     Status: Abnormal   Collection Time: 12/08/18 10:20 PM   Specimen: BLOOD LEFT HAND  Result Value Ref Range Status   Specimen Description BLOOD  LEFT HAND  Final   Special Requests   Final    BOTTLES DRAWN AEROBIC ONLY Blood Culture results may not be optimal due to an inadequate volume of blood received in culture bottles   Culture  Setup Time   Final    GRAM POSITIVE COCCI IN CLUSTERS AEROBIC BOTTLE ONLY CRITICAL RESULT CALLED TO, READ BACK BY AND VERIFIED WITH: Diona Browner PharmD 16:50 12/09/18 ([email or web address]) Performed at [facility], [address], [place], [place] [address]    Culture STAPHYLOCOCCUS AUREUS (A)  Final   Report Status 12/11/2018 FINAL  Final   Organism ID, Bacteria STAPHYLOCOCCUS AUREUS  Final      Susceptibility   Staphylococcus aureus - MIC*    CIPROFLOXACIN <=0.5 SENSITIVE Sensitive     ERYTHROMYCIN >=8 RESISTANT Resistant     GENTAMICIN <=0.5 SENSITIVE Sensitive     OXACILLIN <=0.25 SENSITIVE Sensitive     TETRACYCLINE <=1 SENSITIVE Sensitive     VANCOMYCIN 1  SENSITIVE Sensitive     TRIMETH/SULFA <=10 SENSITIVE Sensitive     CLINDAMYCIN <=0.25 SENSITIVE Sensitive     RIFAMPIN <=0.5 SENSITIVE Sensitive     Inducible Clindamycin NEGATIVE Sensitive     * STAPHYLOCOCCUS AUREUS  Culture, blood (routine x 2) Call MD if unable to obtain prior to antibiotics being given     Status: None   Collection Time: 12/08/18 10:20 PM   Specimen: BLOOD RIGHT ARM  Result Value Ref Range Status   Specimen Description BLOOD RIGHT ARM  Final   Special Requests   Final    BOTTLES DRAWN AEROBIC ONLY Blood Culture results may not be optimal due to an inadequate volume of blood received in culture bottles   Culture   Final    NO GROWTH 5 DAYS Performed at [facility], [address], [place], [place] [address]    Report Status 12/13/2018 FINAL  Final  Blood Culture ID Panel (Reflexed)     Status: Abnormal   Collection Time: 12/08/18 10:20 PM  Result Value Ref Range Status   Enterococcus species NOT DETECTED NOT DETECTED Final   Listeria monocytogenes NOT DETECTED NOT DETECTED Final   Staphylococcus species DETECTED (A) NOT DETECTED Final    Comment: CRITICAL RESULT CALLED TO, READ BACK BY AND VERIFIED WITH: Diona Browner PharmD 15:50 12/09/18 ([email or web address])    Staphylococcus aureus (BCID) DETECTED (A) NOT DETECTED Final    Comment: Methicillin (oxacillin) susceptible Staphylococcus aureus (MSSA). Preferred therapy is anti staphylococcal beta lactam antibiotic (Cefazolin or Nafcillin), unless clinically contraindicated. CRITICAL RESULT CALLED TO, READ BACK BY AND VERIFIED WITH: Diona Browner PharmD 15:50 12/09/18 ([email or web address])    Methicillin resistance NOT DETECTED NOT DETECTED Final   Streptococcus species NOT DETECTED NOT DETECTED Final   Streptococcus agalactiae NOT DETECTED NOT DETECTED Final   Streptococcus pneumoniae NOT DETECTED NOT DETECTED Final   Streptococcus pyogenes NOT DETECTED NOT DETECTED Final   Acinetobacter baumannii NOT DETECTED NOT DETECTED Final    Enterobacteriaceae species NOT DETECTED NOT DETECTED Final   Enterobacter cloacae complex NOT DETECTED NOT DETECTED Final   Escherichia coli NOT DETECTED NOT DETECTED Final   Klebsiella oxytoca NOT DETECTED NOT DETECTED Final   Klebsiella pneumoniae NOT DETECTED NOT DETECTED Final   Proteus species NOT DETECTED NOT DETECTED Final   Serratia marcescens NOT DETECTED NOT DETECTED Final   Haemophilus influenzae NOT DETECTED NOT DETECTED Final   Neisseria meningitidis NOT DETECTED NOT DETECTED Final   Pseudomonas aeruginosa NOT DETECTED NOT DETECTED Final  Candida albicans NOT DETECTED NOT DETECTED Final   Candida glabrata NOT DETECTED NOT DETECTED Final   Candida krusei NOT DETECTED NOT DETECTED Final   Candida parapsilosis NOT DETECTED NOT DETECTED Final   Candida tropicalis NOT DETECTED NOT DETECTED Final    Comment: Performed at [facility], [place] [address], [place], [place] [address]  SARS Coronavirus 2 (CEPHEID- Performed in [place] hospital lab), Hosp Order     Status: None   Collection Time: 12/08/18 11:45 PM   Specimen: Nasopharyngeal Swab  Result Value Ref Range Status   SARS Coronavirus 2 NEGATIVE NEGATIVE Final    Comment: (NOTE) If result is NEGATIVE SARS-CoV-2 target nucleic acids are NOT DETECTED. The SARS-CoV-2 RNA is generally detectable in upper and lower  respiratory specimens during the acute phase of infection. The lowest  concentration of SARS-CoV-2 viral copies this assay can detect is 250  copies / mL. A negative result does not preclude SARS-CoV-2 infection  and should not be used as the sole basis for treatment or other  patient management decisions.  A negative result may occur with  improper specimen collection / handling, submission of specimen other  than nasopharyngeal swab, presence of viral mutation(s) within the  areas targeted by this assay, and inadequate number of viral copies  (<250 copies / mL). A negative result must be combined with  clinical  observations, patient history, and epidemiological information. If result is POSITIVE SARS-CoV-2 target nucleic acids are DETECTED. The SARS-CoV-2 RNA is generally detectable in upper and lower  respiratory specimens dur ing the acute phase of infection.  Positive  results are indicative of active infection with SARS-CoV-2.  Clinical  correlation with patient history and other diagnostic information is  necessary to determine patient infection status.  Positive results do  not rule out bacterial infection or co-infection with other viruses. If result is PRESUMPTIVE POSTIVE SARS-CoV-2 nucleic acids MAY BE PRESENT.   A presumptive positive result was obtained on the submitted specimen  and confirmed on repeat testing.  While 2019 novel coronavirus  (SARS-CoV-2) nucleic acids may be present in the submitted sample  additional confirmatory testing may be necessary for epidemiological  and / or clinical management purposes  to differentiate between  SARS-CoV-2 and other Sarbecovirus currently known to infect humans.  If clinically indicated additional testing with an alternate test  methodology [phone]) is advised. The SARS-CoV-2 RNA is generally  detectable in upper and lower respiratory sp ecimens during the acute  phase of infection. The expected result is Negative. Fact Sheet for Patients:  StrictlyIdeas.no Fact Sheet for Healthcare Providers: BankingDealers.co.za This test is not yet approved or cleared by the [place] FDA and has been authorized for detection and/or diagnosis of SARS-CoV-2 by FDA under an Emergency Use Authorization (EUA).  This EUA will remain in effect (meaning this test can be used) for the duration of the COVID-19 declaration under Section 564(b)(1) of the Act, 21 U.S.C. section 360bbb-3(b)(1), unless the authorization is terminated or revoked sooner. Performed at [facility], [place]  [address], [place], [place] [address]   MRSA PCR Screening     Status: None   Collection Time: 12/09/18 12:10 AM   Specimen: Nasopharyngeal  Result Value Ref Range Status   MRSA by PCR NEGATIVE NEGATIVE Final    Comment:        The GeneXpert MRSA Assay (FDA approved for NASAL specimens only), is one component of a comprehensive MRSA colonization surveillance program. It is not  intended to diagnose MRSA infection nor to guide or monitor treatment for MRSA infections. Performed at [facility], [place] [address], [place], [place] [address]   Culture, blood (routine x 2)     Status: None   Collection Time: 12/09/18  9:47 PM   Specimen: BLOOD  Result Value Ref Range Status   Specimen Description BLOOD LEFT HAND  Final   Special Requests   Final    BOTTLES DRAWN AEROBIC ONLY Blood Culture results may not be optimal due to an inadequate volume of blood received in culture bottles   Culture   Final    NO GROWTH 5 DAYS Performed at [facility], [place] [address], [place], [place] [address]    Report Status 12/14/2018 FINAL  Final  Culture, blood (routine x 2)     Status: None   Collection Time: 12/09/18  9:55 PM   Specimen: BLOOD  Result Value Ref Range Status   Specimen Description BLOOD RIGHT HAND  Final   Special Requests   Final    BOTTLES DRAWN AEROBIC AND ANAEROBIC Blood Culture results may not be optimal due to an inadequate volume of blood received in culture bottles   Culture   Final    NO GROWTH 5 DAYS Performed at [facility], [place] [address], [place], [place] [address]    Report Status 12/14/2018 FINAL  Final     Labs: BNP (last 3 results) No results for input(s): BNP in the last 8760 hours. Basic Metabolic Panel: Recent Labs  Lab 12/13/18 0612  NA 138  K 3.7  CL 100  CO2 28  GLUCOSE 95  BUN <5*  CREATININE 0.63  CALCIUM 8.4*   Liver Function Tests: No results for input(s): AST, ALT, ALKPHOS, BILITOT, PROT, ALBUMIN in the last 168 hours. No  results for input(s): LIPASE, AMYLASE in the last 168 hours. No results for input(s): AMMONIA in the last 168 hours. CBC: Recent Labs  Lab 12/18/18 0635  WBC 6.8  HGB 11.4*  HCT 36.2  MCV 93.8  PLT 235   Cardiac Enzymes: No results for input(s): CKTOTAL, CKMB, CKMBINDEX, TROPONINI in the last 168 hours. BNP: Invalid input(s): POCBNP CBG: No results for input(s): GLUCAP in the last 168 hours. D-Dimer No results for input(s): DDIMER in the last 72 hours. Hgb A1c No results for input(s): HGBA1C in the last 72 hours. Lipid Profile No results for input(s): CHOL, HDL, LDLCALC, TRIG, CHOLHDL, LDLDIRECT in the last 72 hours. Thyroid function studies No results for input(s): TSH, T4TOTAL, T3FREE, THYROIDAB in the last 72 hours.  Invalid input(s): FREET3 Anemia work up No results for input(s): VITAMINB12, FOLATE, FERRITIN, TIBC, IRON, RETICCTPCT in the last 72 hours. Urinalysis    Component Value Date/Time   COLORURINE YELLOW 05/10/2010 1929   APPEARANCEUR TURBID (A) 05/10/2010 1929   LABSPEC 1.020 05/10/2010 1929   PHURINE 7.0 05/10/2010 1929   GLUCOSEU NEGATIVE 05/10/2010 1929   HGBUR TRACE (A) 05/10/2010 1929   BILIRUBINUR NEGATIVE 05/10/2010 1929   KETONESUR NEGATIVE 05/10/2010 1929   PROTEINUR 30 (A) 05/10/2010 1929   UROBILINOGEN 1.0 05/10/2010 1929   NITRITE POSITIVE (A) 05/10/2010 1929   LEUKOCYTESUR LARGE (A) 05/10/2010 1929   Sepsis Labs Invalid input(s): PROCALCITONIN,  WBC,  LACTICIDVEN   Time coordinating discharge: 35 minutes  SIGNED:  Mercy Riding, MD  [facility] 12/18/2018, 3:27 PM  If 7PM-7AM, please contact night-coverage [email or web address] Password [email or web address]

## 2019-01-03 ENCOUNTER — Inpatient Hospital Stay: Payer: Self-pay | Admitting: Infectious Diseases

## 2020-04-24 DIAGNOSIS — R0600 Dyspnea, unspecified: Secondary | ICD-10-CM | POA: Diagnosis not present

## 2020-08-08 IMAGING — CR LEFT SHOULDER - 2+ VIEW
4 series · 4 of 4 positions shown · non-contrast
Comparison: None.

CLINICAL DATA: Left shoulder pain after multiple falls this week.

EXAM:
LEFT SHOULDER - 2+ VIEW

[shoulder grashey]
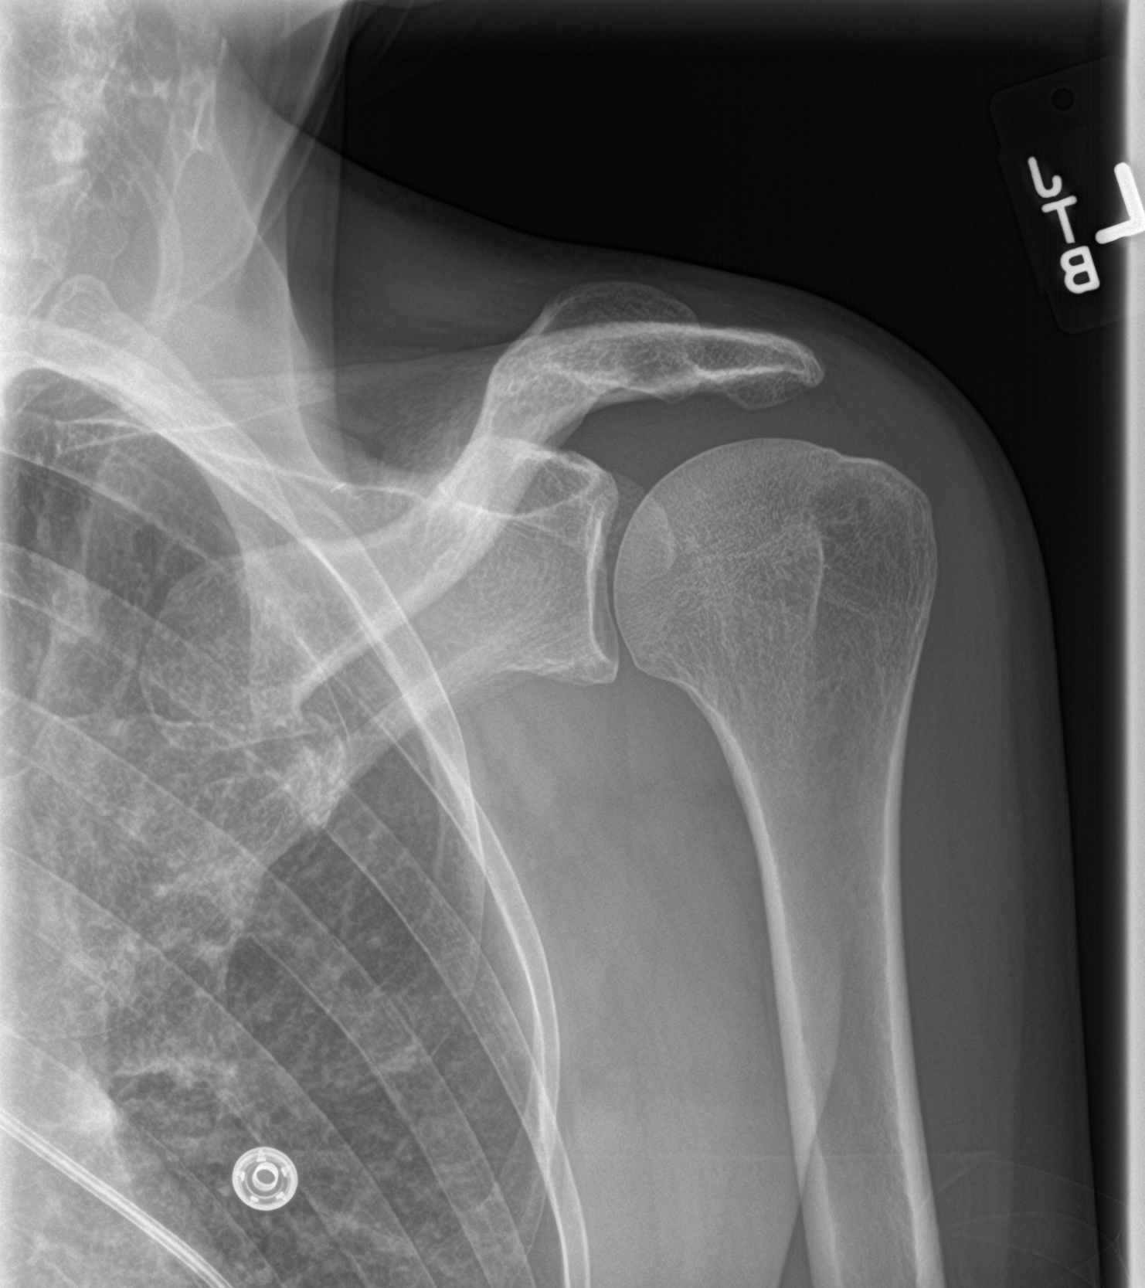

[shoulder y view]
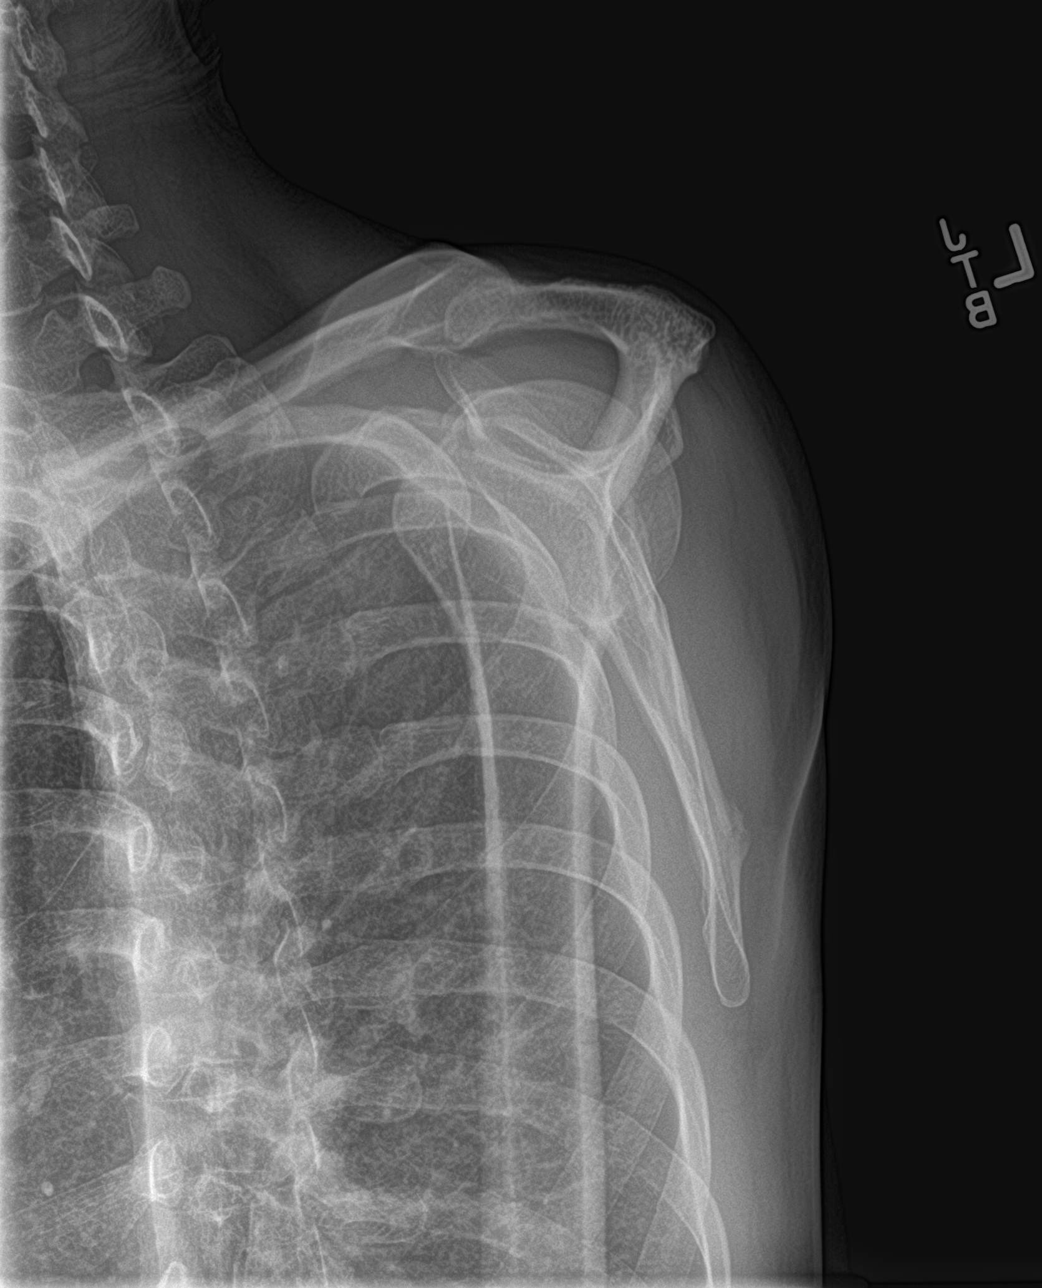

[shoulder axillary]
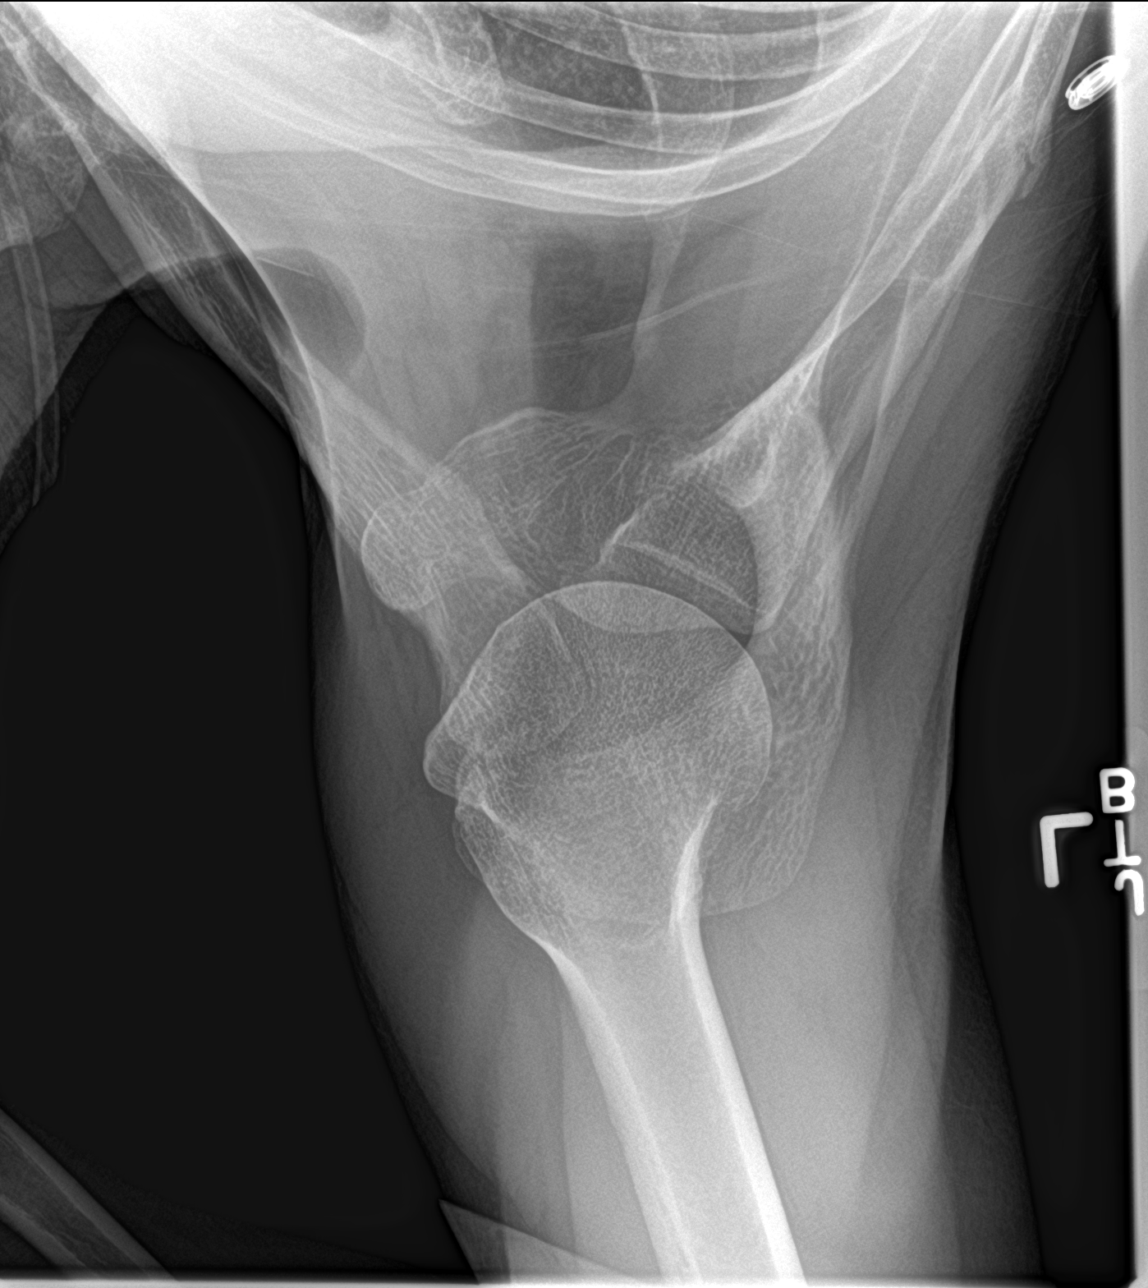

[shoulder ap neutral]
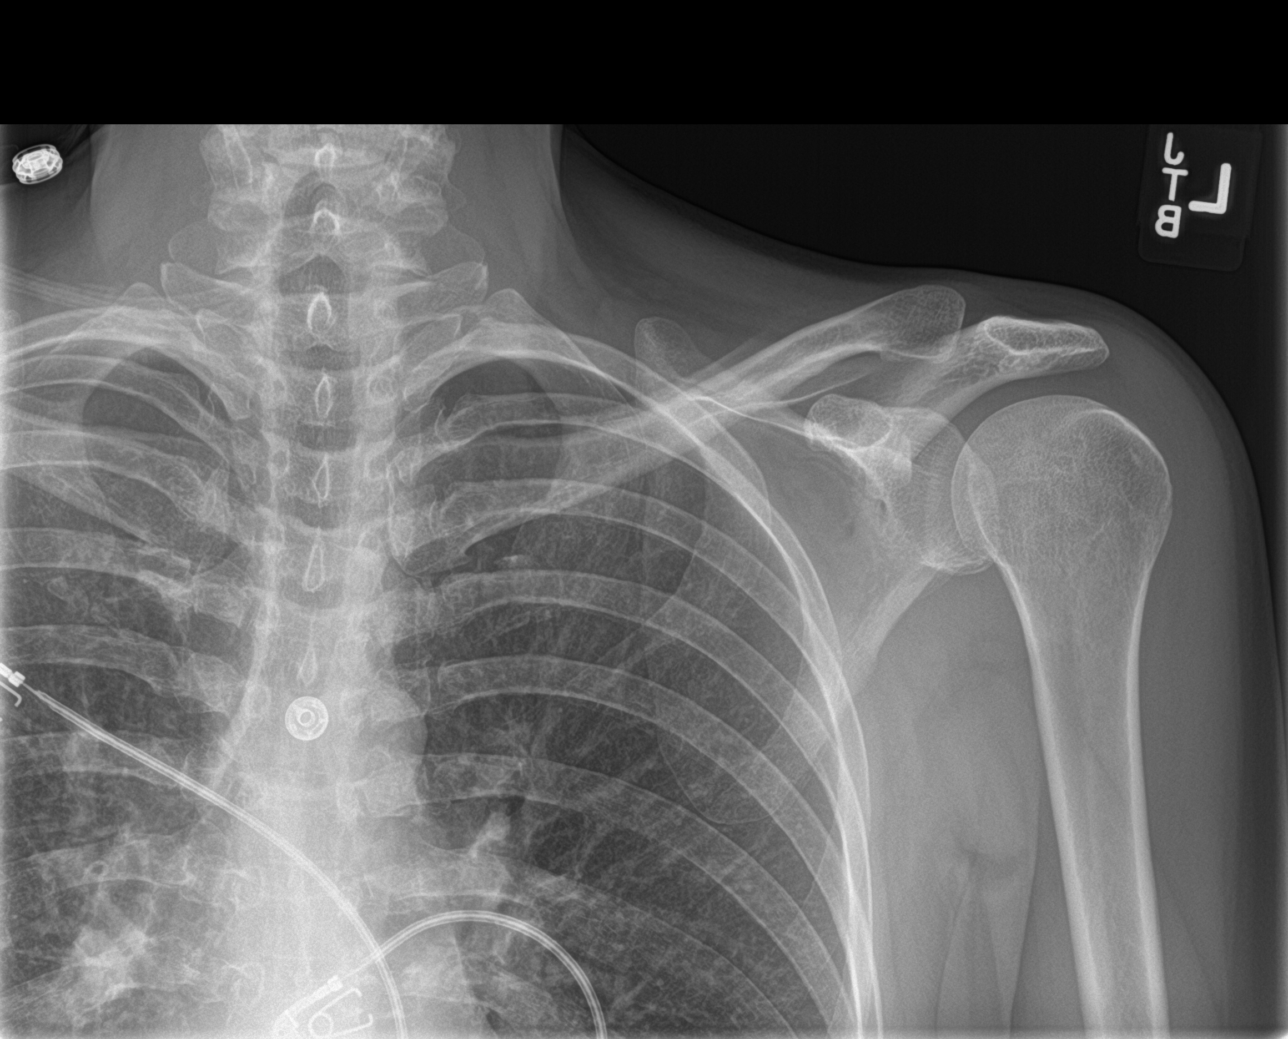

[4 of 4 positions shown; findings below may reference images not displayed]

FINDINGS: There is no evidence of fracture or dislocation. There is no
evidence of arthropathy or other focal bone abnormality. Soft
tissues are unremarkable.
IMPRESSION: Negative.

## 2020-08-10 IMAGING — MR MRI OF THE LEFT SHOULDER WITHOUT CONTRAST
4 of 5 series · 19 of 40 positions shown · non-contrast
Comparison: Radiographs 12/09/2018

CLINICAL DATA: Multiple falls.  Left shoulder pain.

EXAM:
MRI OF THE LEFT SHOULDER WITHOUT CONTRAST
TECHNIQUE: Multiplanar, multisequence MR imaging of the shoulder was performed.
No intravenous contrast was administered.

[Series 3: PD fat-sat · axial · 4.0mm · 0.31mm/px · z∈[-93,+2]mm · 8 of 21 slices shown]
[im 1/21]
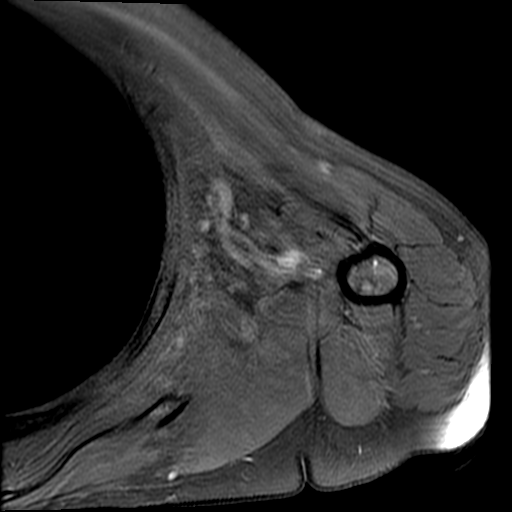
[im 3/21]
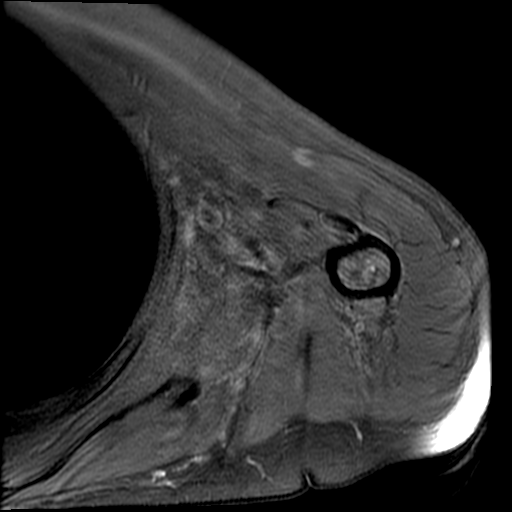
[im 6/21]
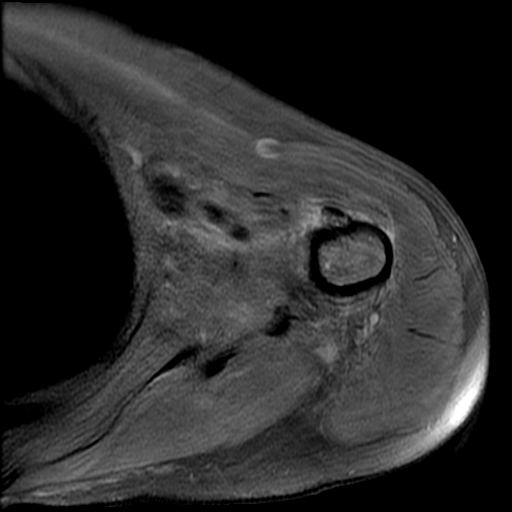
[im 9/21]
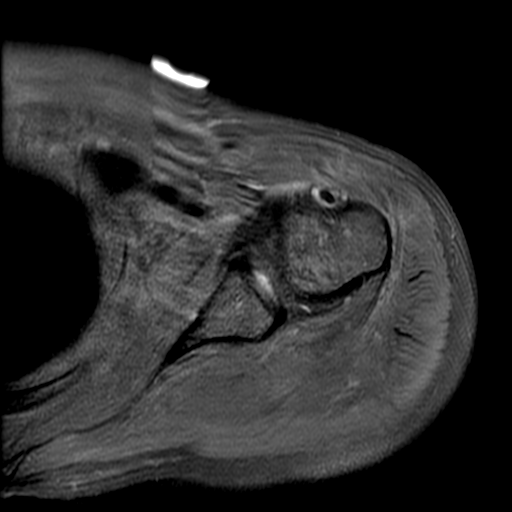
[im 12/21]
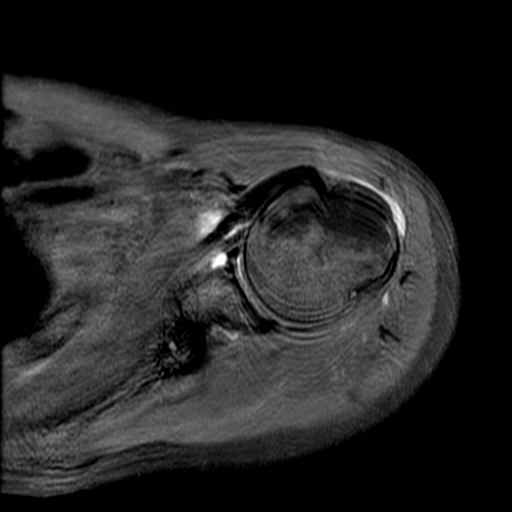
[im 15/21]
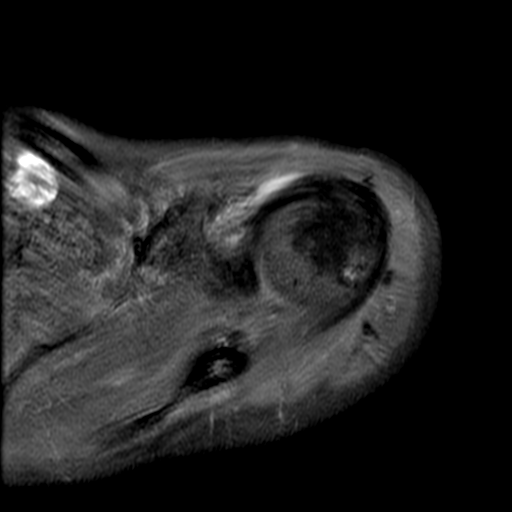
[im 18/21]
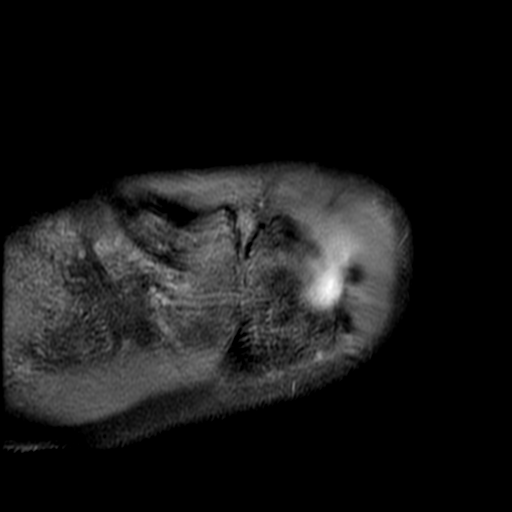
[im 21/21]
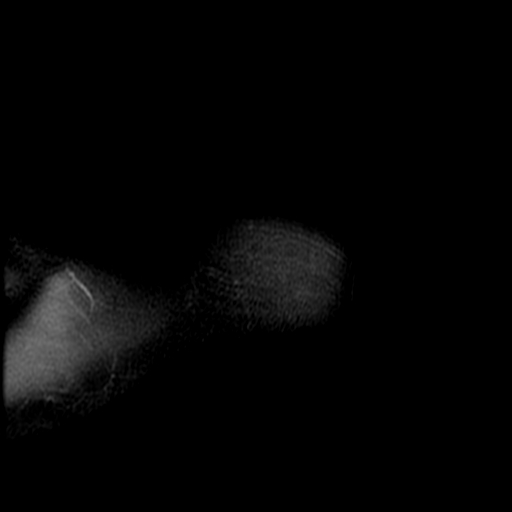

[Series 5: PD · coronal · 4.0mm · 0.31mm/px · 5 of 20 slices shown]
[im 1/20]
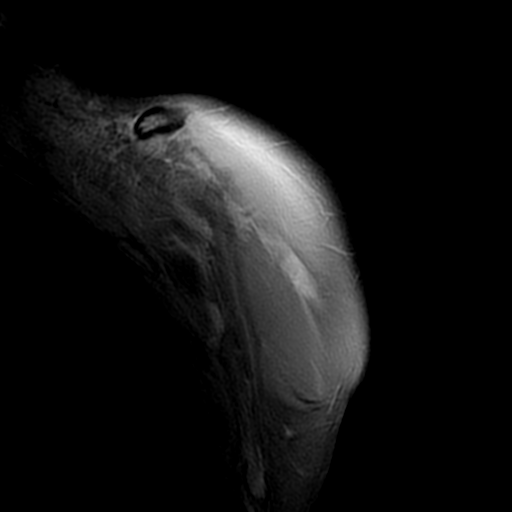
[im 4/20]
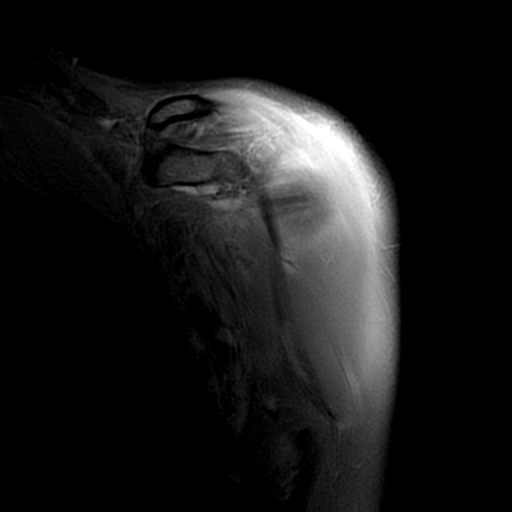
[im 7/20]
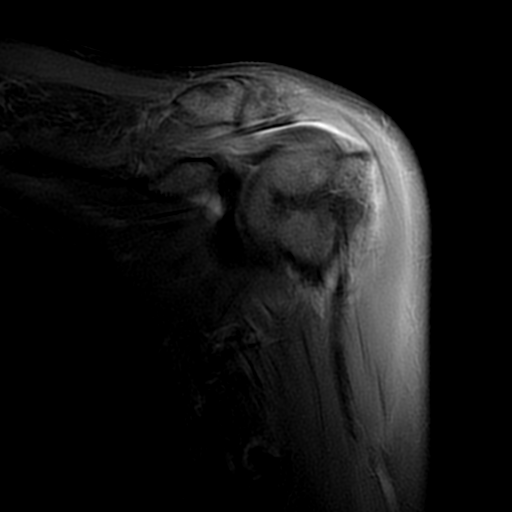
[im 10/20]
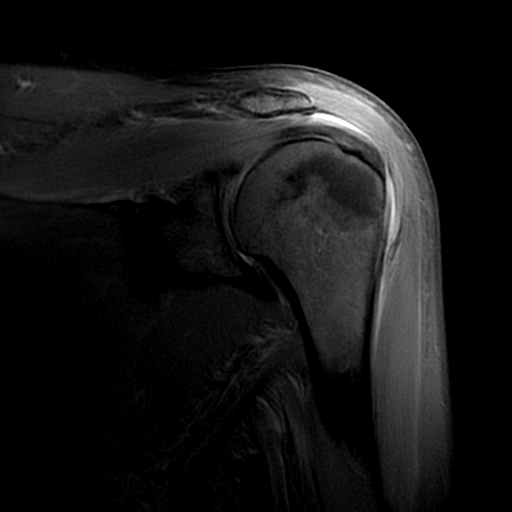
[im 16/20]
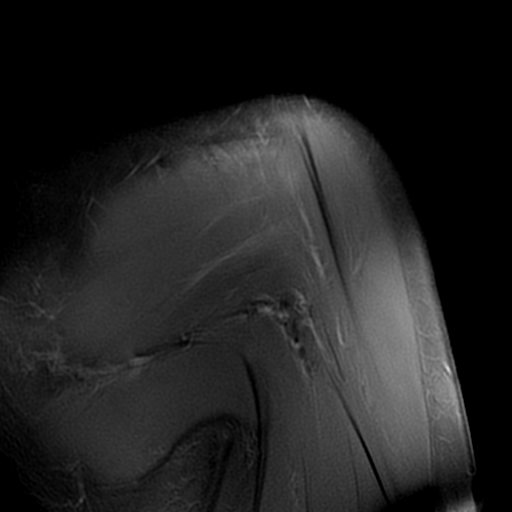

[Series 6: T2 fat-sat · coronal · 4.0mm · 0.31mm/px · 3 of 20 slices shown (1 of 2)]
[im 4/20]
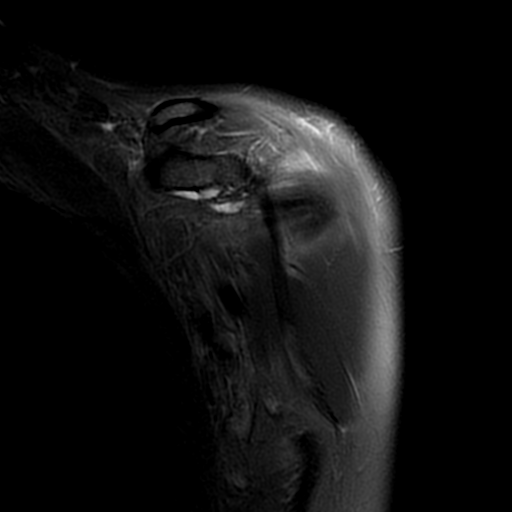
[im 10/20]
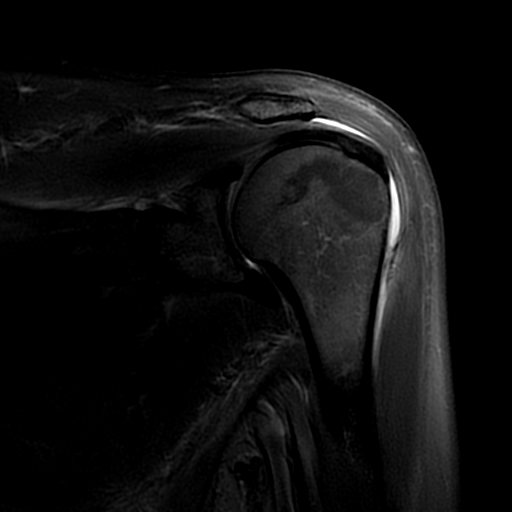
[im 16/20]
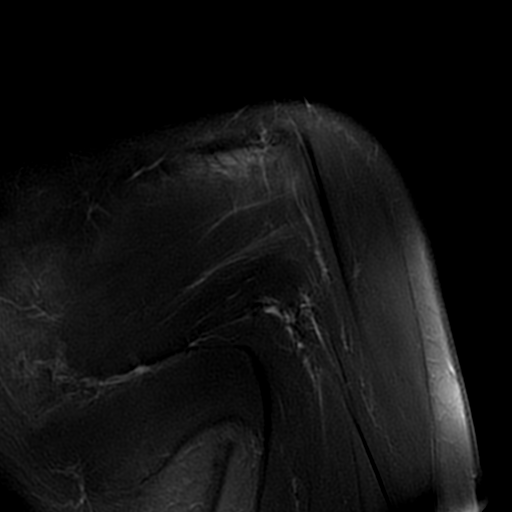

[Series 7: T2 fat-sat · sagittal · 3.0mm · 0.27mm/px · 3 of 25 slices shown (2 of 2)]
[im 4/25]
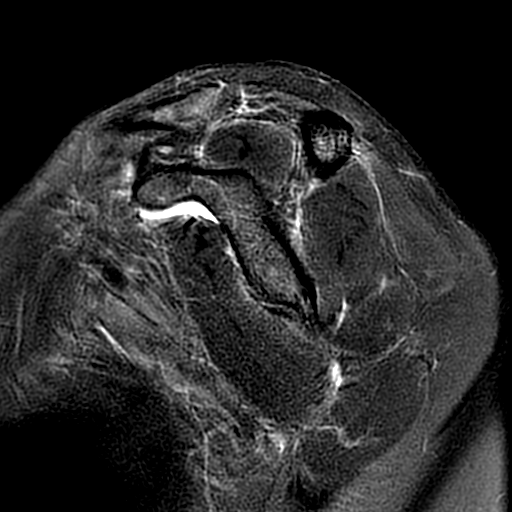
[im 13/25]
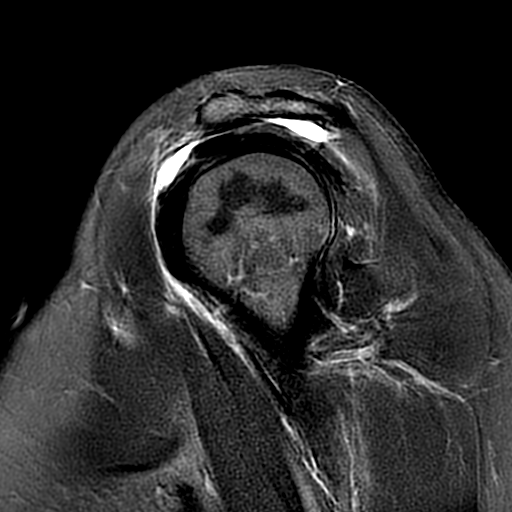
[im 22/25]
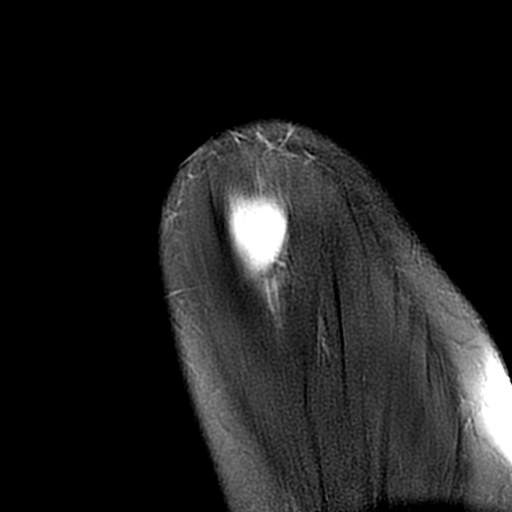

[19 of 40 positions shown; findings below may reference images not displayed]

FINDINGS: Rotator cuff: Normal rotator cuff tendons. No significant
tendinopathy and no partial or full-thickness tear.

Muscles:  Normal

Biceps long head:  Intact

Acromioclavicular Joint: No degenerative changes. Type 2-3 acromion.
No lateral downsloping or undersurface spurring.

Glenohumeral Joint: Normal articular cartilage.  No joint effusion.

Labrum:  No definite labral tears.

Bones:  No acute bony findings.

Other: Moderate subacromial/subdeltoid bursitis.
IMPRESSION: 1. Intact rotator cuff tendons.
2. No acute bony findings.
3. Intact long head biceps tendon and glenoid labrum.
4. Moderate subacromial/subdeltoid bursitis.

## 2021-02-06 ENCOUNTER — Emergency Department (HOSPITAL_COMMUNITY)
Admission: EM | Admit: 2021-02-06 | Discharge: 2021-02-06 | Disposition: A | Discharge status: Home / Self Care | Payer: Medicaid Other | Attending: Emergency Medicine | Admitting: Emergency Medicine

## 2021-02-06 ENCOUNTER — Other Ambulatory Visit: Payer: Self-pay

## 2021-02-06 ENCOUNTER — Emergency Department (HOSPITAL_COMMUNITY): Payer: Medicaid Other

## 2021-02-06 ENCOUNTER — Encounter (HOSPITAL_COMMUNITY): Payer: Self-pay

## 2021-02-06 DIAGNOSIS — Z7951 Long term (current) use of inhaled steroids: Secondary | ICD-10-CM | POA: Diagnosis not present

## 2021-02-06 DIAGNOSIS — R1012 Left upper quadrant pain: Secondary | ICD-10-CM | POA: Diagnosis not present

## 2021-02-06 DIAGNOSIS — R112 Nausea with vomiting, unspecified: Secondary | ICD-10-CM | POA: Insufficient documentation

## 2021-02-06 DIAGNOSIS — N189 Chronic kidney disease, unspecified: Secondary | ICD-10-CM | POA: Diagnosis not present

## 2021-02-06 DIAGNOSIS — J449 Chronic obstructive pulmonary disease, unspecified: Secondary | ICD-10-CM | POA: Insufficient documentation

## 2021-02-06 DIAGNOSIS — F17210 Nicotine dependence, cigarettes, uncomplicated: Secondary | ICD-10-CM | POA: Diagnosis not present

## 2021-02-06 DIAGNOSIS — R1013 Epigastric pain: Secondary | ICD-10-CM | POA: Diagnosis not present

## 2021-02-06 LAB — COMPREHENSIVE METABOLIC PANEL
ALT: 28 U/L (ref 0–44)
AST: 22 U/L (ref 15–41)
Albumin: 3.6 g/dL (ref 3.5–5.0)
Alkaline Phosphatase: 46 U/L (ref 38–126)
Anion gap: 8 (ref 5–15)
BUN: 14 mg/dL (ref 6–20)
CO2: 23 mmol/L (ref 22–32)
Calcium: 8.3 mg/dL — ABNORMAL LOW (ref 8.9–10.3)
Chloride: 105 mmol/L (ref 98–111)
Creatinine, Ser: 0.72 mg/dL (ref 0.44–1.00)
GFR, Estimated: >60 mL/min (ref >60)
Glucose, Bld: 82 mg/dL (ref 70–99)
Potassium: 3.3 mmol/L — ABNORMAL LOW (ref 3.5–5.1)
Sodium: 136 mmol/L (ref 135–145)
Total Bilirubin: 0.9 mg/dL (ref 0.3–1.2)
Total Protein: 6.5 g/dL (ref 6.5–8.1)

## 2021-02-06 LAB — I-STAT BETA HCG BLOOD, ED (MC, WL, AP ONLY): I-stat hCG, quantitative: <5 m[IU]/mL (ref <5)

## 2021-02-06 LAB — CBC WITH DIFFERENTIAL/PLATELET
Abs Immature Granulocytes: 0.02 10*3/uL (ref 0.00–0.07)
Basophils Absolute: 0 10*3/uL (ref 0.0–0.1)
Basophils Relative: 0 %
Eosinophils Absolute: 0 10*3/uL (ref 0.0–0.5)
Eosinophils Relative: 1 %
HCT: 40.9 % (ref 36.0–46.0)
Hemoglobin: 13.7 g/dL (ref 12.0–15.0)
Immature Granulocytes: 0 %
Lymphocytes Relative: 29 %
Lymphs Abs: 1.6 10*3/uL (ref 0.7–4.0)
MCH: 31.1 pg (ref 26.0–34.0)
MCHC: 33.5 g/dL (ref 30.0–36.0)
MCV: 92.7 fL (ref 80.0–100.0)
Monocytes Absolute: 0.4 10*3/uL (ref 0.1–1.0)
Monocytes Relative: 7 %
Neutro Abs: 3.5 10*3/uL (ref 1.7–7.7)
Neutrophils Relative %: 63 %
Platelets: 134 10*3/uL — ABNORMAL LOW (ref 150–400)
RBC: 4.41 MIL/uL (ref 3.87–5.11)
RDW: 14.6 % (ref 11.5–15.5)
WBC: 5.5 10*3/uL (ref 4.0–10.5)
nRBC: 0 % (ref 0.0–0.2)

## 2021-02-06 LAB — I-STAT CHEM 8, ED
BUN: 15 mg/dL (ref 6–20)
Calcium, Ion: 1.14 mmol/L — ABNORMAL LOW (ref 1.15–1.40)
Chloride: 106 mmol/L (ref 98–111)
Creatinine, Ser: 0.6 mg/dL (ref 0.44–1.00)
Glucose, Bld: 83 mg/dL (ref 70–99)
HCT: 39 % (ref 36.0–46.0)
Hemoglobin: 13.3 g/dL (ref 12.0–15.0)
Potassium: 3.4 mmol/L — ABNORMAL LOW (ref 3.5–5.1)
Sodium: 139 mmol/L (ref 135–145)
TCO2: 25 mmol/L (ref 22–32)

## 2021-02-06 LAB — LIPASE, BLOOD: Lipase: 21 U/L (ref 11–51)

## 2021-02-06 MED ORDER — IOHEXOL 350 MG/ML SOLN
100.0000 mL | Freq: Once | INTRAVENOUS | Status: AC | PRN
  Administered 2021-02-06: 100 mL via INTRAVENOUS

## 2021-02-06 MED ORDER — SODIUM CHLORIDE 0.9 % IV BOLUS
1000.0000 mL | Freq: Once | INTRAVENOUS | Status: AC
  Administered 2021-02-06: 1000 mL via INTRAVENOUS

## 2021-02-06 MED ORDER — DROPERIDOL 2.5 MG/ML IJ SOLN
1.2500 mg | Freq: Once | INTRAMUSCULAR | Status: AC
  Administered 2021-02-06: 1.25 mg via INTRAVENOUS
  Filled 2021-02-06: qty 2

## 2021-02-06 MED ORDER — FENTANYL CITRATE PF 50 MCG/ML IJ SOSY
100.0000 ug | PREFILLED_SYRINGE | Freq: Once | INTRAMUSCULAR | Status: AC
  Administered 2021-02-06: 100 ug via INTRAVENOUS
  Filled 2021-02-06: qty 2

## 2021-02-06 MED ORDER — DIPHENHYDRAMINE HCL 50 MG/ML IJ SOLN
25.0000 mg | Freq: Once | INTRAMUSCULAR | Status: AC
  Administered 2021-02-06: 25 mg via INTRAVENOUS
  Filled 2021-02-06: qty 1

## 2021-02-06 MED ORDER — PROMETHAZINE HCL 25 MG RE SUPP: 25.0000 mg | Freq: Four times a day (QID) | RECTAL | 0 refills | Status: AC | PRN

## 2021-02-06 MED ORDER — ALUM & MAG HYDROXIDE-SIMETH 200-200-20 MG/5ML PO SUSP
30.0000 mL | Freq: Once | ORAL | Status: AC
  Administered 2021-02-06: 30 mL via ORAL
  Filled 2021-02-06: qty 30

## 2021-02-06 NOTE — ED Provider Notes (Signed)
[facility] EMERGENCY DEPARTMENT Provider Note   CSN: [id number] Arrival date & time: 02/06/21  1747     History Chief Complaint  Patient presents with   Abdominal Pain    [name] is a 50 y.o. female.  50 yo F with a chief complaints of epigastric abdominal pain nausea and vomiting.  Going on for about 48 hours.  No fevers or chills no diarrhea.  She feels like she has a mass to her left upper abdomen that occurs off and on and that she has to push back in.  Occurs about every 10 minutes or so and causes her to vomit.  She denies any trauma.  Not really able to eat and drink.  Has Phenergan at home that she has been taking without improvement.  The history is provided by the patient.  Abdominal Pain Pain location:  Epigastric and LUQ Pain quality: aching and cramping   Pain radiates to:  Does not radiate Pain severity:  Moderate Onset quality:  Gradual Duration:  2 days Timing:  Constant Progression:  Worsening Chronicity:  New Relieved by:  Nothing Worsened by:  Nothing Ineffective treatments:  None tried Associated symptoms: nausea and vomiting   Associated symptoms: no chest pain, no chills, no dysuria, no fever and no shortness of breath       Past Medical History:  Diagnosis Date   Chronic kidney disease    COPD (chronic obstructive pulmonary disease) (HCC)     Patient Active Problem List   Diagnosis Date Noted   COPD (chronic obstructive pulmonary disease) (HCC) 12/09/2018   Bacteremia due to methicillin susceptible Staphylococcus aureus (MSSA) 12/09/2018   Left shoulder pain 12/09/2018   Normocytic anemia 12/09/2018   Thrombocytopenia (HCC) 12/09/2018   IVDU (intravenous drug user) 12/09/2018   History of hepatitis C 12/09/2018   Acute on chronic respiratory failure with hypoxia (HCC) 12/08/2018   Pneumonia 12/08/2018   Hyponatremia 12/08/2018   Hypokalemia 12/08/2018    Past Surgical History:  Procedure Laterality Date    FOOT FRACTURE SURGERY     R kidney stent     TEE WITHOUT CARDIOVERSION N/A 12/14/2018   Procedure: TRANSESOPHAGEAL ECHOCARDIOGRAM (TEE);  Surgeon: Elease Hashimoto Deloris Ping, MD;  Location: [facility] ENDOSCOPY;  Service: Cardiovascular;  Laterality: N/A;     OB History   No obstetric history on file.     Family History  Problem Relation Age of Onset   COPD Mother    Leukemia Father     Social History   Tobacco Use   Smoking status: Some Days   Smokeless tobacco: Never  Substance Use Topics   Alcohol use: Not Currently    Comment: patient denies   Drug use: Not Currently    Comment: patient denies    Home Medications Prior to Admission medications   Medication Sig Start Date End Date Taking? Authorizing Provider  ALPRAZolam Prudy Feeler) 1 MG tablet Take 1 mg by mouth 3 (three) times daily.  09/05/18  Yes [provider]  budesonide-formoterol (SYMBICORT) 160-4.5 MCG/ACT inhaler SMARTSIG:2 Puff(s) By Mouth Twice Daily 12/28/20  Yes [provider]  ferrous sulfate 325 (65 FE) MG tablet Take 1 tablet (325 mg total) by mouth 2 (two) times daily with a meal. 12/18/18  Yes Gonfa, Taye T, MD  gabapentin (NEURONTIN) 800 MG tablet Take 800 mg by mouth 4 (four) times daily.   Yes [provider]  metoprolol succinate (TOPROL-XL) 50 MG 24 hr tablet Take 50 mg by mouth  daily as needed. 09/17/20  Yes [provider]  oxyCODONE (ROXICODONE) 15 MG immediate release tablet Take 15 mg by mouth every 4 (four) hours as needed for pain.   Yes [provider]  pantoprazole (PROTONIX) 40 MG tablet Take 1 tablet (40 mg total) by mouth daily. 12/18/18  Yes Almon Hercules, MD  PROAIR HFA 108 [phone] Base) MCG/ACT inhaler Inhale 2 puffs into the lungs every 6 (six) hours as needed for shortness of breath. 01/20/21  Yes [provider]  promethazine (PHENERGAN) 25 MG suppository Place 1 suppository (25 mg total) rectally every 6 (six) hours as needed for nausea or vomiting. 02/06/21   Yes Melene Plan, DO  sertraline (ZOLOFT) 100 MG tablet Take 100 mg by mouth daily. 01/16/21  Yes [provider]  traZODone (DESYREL) 50 MG tablet Take 50 mg by mouth at bedtime as needed. 01/01/21  Yes [provider]  busPIRone (BUSPAR) 7.5 MG tablet Take 1 tablet (7.5 mg total) by mouth 3 (three) times daily. Patient not taking: Reported on 02/06/2021 12/18/18   Almon Hercules, MD    Allergies    Morphine and related and Zofran [ondansetron hcl]  Review of Systems   Review of Systems  Constitutional:  Negative for chills and fever.  HENT:  Negative for congestion and rhinorrhea.   Eyes:  Negative for redness and visual disturbance.  Respiratory:  Negative for shortness of breath and wheezing.   Cardiovascular:  Negative for chest pain and palpitations.  Gastrointestinal:  Positive for abdominal pain, nausea and vomiting.  Genitourinary:  Negative for dysuria and urgency.  Musculoskeletal:  Negative for arthralgias and myalgias.  Skin:  Negative for pallor and wound.  Neurological:  Negative for dizziness and headaches.   Physical Exam Updated Vital Signs BP 97/63   Pulse (!) 58   Temp 98.4 F (36.9 C) (Oral)   Resp 12   Ht 5\' 5"  (1.651 m)   Wt 61 kg   SpO2 95%   BMI 22.38 kg/m   Physical Exam Vitals and nursing note reviewed.  Constitutional:      General: She is not in acute distress.    Appearance: She is well-developed. She is not diaphoretic.  HENT:     Head: Normocephalic and atraumatic.  Eyes:     Pupils: Pupils are equal, round, and reactive to light.  Cardiovascular:     Rate and Rhythm: Normal rate and regular rhythm.     Heart sounds: No murmur heard.   No friction rub. No gallop.  Pulmonary:     Effort: Pulmonary effort is normal.     Breath sounds: No wheezing or rales.  Abdominal:     General: There is no distension.     Palpations: Abdomen is soft.     Tenderness: There is no abdominal tenderness.     Comments: Benign abdominal exam.   Prior abdominal surgical scar.  Musculoskeletal:        General: No tenderness.     Cervical back: Normal range of motion and neck supple.  Skin:    General: Skin is warm and dry.  Neurological:     Mental Status: She is alert and oriented to person, place, and time.  Psychiatric:        Behavior: Behavior normal.    ED Results / Procedures / Treatments   Labs (all labs ordered are listed, but only abnormal results are displayed) Labs Reviewed  COMPREHENSIVE METABOLIC PANEL - Abnormal; Notable for the following  components:      Result Value   Potassium 3.3 (*)    Calcium 8.3 (*)    All other components within normal limits  CBC WITH DIFFERENTIAL/PLATELET - Abnormal; Notable for the following components:   Platelets 134 (*)    All other components within normal limits  I-STAT CHEM 8, ED - Abnormal; Notable for the following components:   Potassium 3.4 (*)    Calcium, Ion 1.14 (*)    All other components within normal limits  LIPASE, BLOOD  URINALYSIS, ROUTINE W REFLEX MICROSCOPIC  I-STAT BETA HCG BLOOD, ED ([facility], [facility], [facility] ONLY)    EKG None  Radiology CT ABDOMEN PELVIS W CONTRAST  Result Date: 02/06/2021 CLINICAL DATA:  Epigastric pain EXAM: CT ABDOMEN AND PELVIS WITH CONTRAST TECHNIQUE: Multidetector CT imaging of the abdomen and pelvis was performed using the standard protocol following bolus administration of intravenous contrast. CONTRAST:  100mL OMNIPAQUE IOHEXOL 350 MG/ML SOLN COMPARISON:  07/10/2016 FINDINGS: Lower chest: Mild bibasilar subsegmental atelectasis. Heart size is normal. Hepatobiliary: No focal liver abnormality is seen. No gallstones, gallbladder wall thickening, or biliary dilatation. Pancreas: Unremarkable. No pancreatic ductal dilatation or surrounding inflammatory changes. Spleen: Normal in size without focal abnormality. Adrenals/Urinary Tract: Unremarkable adrenal glands. Kidneys enhance symmetrically without focal lesion, stone, or hydronephrosis. Small  right calyceal diverticulum. Ureters are nondilated. Urinary bladder appears unremarkable. Stomach/Bowel: Stomach is within normal limits. Appendix not definitively visualized. No evidence of bowel wall thickening, distention, or inflammatory changes. Vascular/Lymphatic: No significant vascular findings are present. No enlarged abdominal or pelvic lymph nodes. Reproductive: Uterus and bilateral adnexa are unremarkable. Other: No free fluid. No abdominopelvic fluid collection. No pneumoperitoneum. No abdominal wall hernia. Musculoskeletal: No acute or significant osseous findings. IMPRESSION: No acute abdominopelvic findings. Electronically Signed   By: Duanne GuessNicholas  Plundo D.O.   On: 02/06/2021 21:07    Procedures Procedures   Medications Ordered in ED Medications  droperidol (INAPSINE) 2.5 MG/ML injection 1.25 mg (1.25 mg Intravenous Given 02/06/21 1830)  diphenhydrAMINE (BENADRYL) injection 25 mg (25 mg Intravenous Given 02/06/21 1829)  fentaNYL (SUBLIMAZE) injection 100 mcg (100 mcg Intravenous Given 02/06/21 1829)  sodium chloride 0.9 % bolus 1,000 mL (0 mLs Intravenous Stopped 02/06/21 2122)  iohexol (OMNIPAQUE) 350 MG/ML injection 100 mL (100 mLs Intravenous Contrast Given 02/06/21 2039)  alum & mag hydroxide-simeth (MAALOX/MYLANTA) 200-200-20 MG/5ML suspension 30 mL (30 mLs Oral Given 02/06/21 2121)    ED Course  I have reviewed the triage vital signs and the nursing notes.  Pertinent labs & imaging results that were available during my care of the patient were reviewed by me and considered in my medical decision making (see chart for details).    MDM Rules/Calculators/A&P                           50 yo F with a chief complaints of epigastric abdominal pain.  Going on for couple days with nausea and vomiting.  Fairly benign abdominal exam for me.  Patient describing 10 out of 10 discomfort and intractable nausea and vomiting.  We will give a bolus of IV fluids blood work CT scan and  reassess.  Blood work unremarkable with no significant anemia no significant electrolyte abnormality.  LFTs are unremarkable lipase is normal.  CT scan abdomen pelvis without acute pathology.  Patient feeling better no continued vomiting.  Discharge home.  9:24 PM:  I have discussed the diagnosis/risks/treatment options with the patient and believe the pt  to be eligible for discharge home to follow-up with PCP. We also discussed returning to the ED immediately if new or worsening sx occur. We discussed the sx which are most concerning (e.g., sudden worsening pain, fever, inability to tolerate by mouth) that necessitate immediate return. Medications administered to the patient during their visit and any new prescriptions provided to the patient are listed below.  Medications given during this visit Medications  droperidol (INAPSINE) 2.5 MG/ML injection 1.25 mg (1.25 mg Intravenous Given 02/06/21 1830)  diphenhydrAMINE (BENADRYL) injection 25 mg (25 mg Intravenous Given 02/06/21 1829)  fentaNYL (SUBLIMAZE) injection 100 mcg (100 mcg Intravenous Given 02/06/21 1829)  sodium chloride 0.9 % bolus 1,000 mL (0 mLs Intravenous Stopped 02/06/21 2122)  iohexol (OMNIPAQUE) 350 MG/ML injection 100 mL (100 mLs Intravenous Contrast Given 02/06/21 2039)  alum & mag hydroxide-simeth (MAALOX/MYLANTA) 200-200-20 MG/5ML suspension 30 mL (30 mLs Oral Given 02/06/21 2121)     The patient appears reasonably screen and/or stabilized for discharge and I doubt any other medical condition or other [facility] requiring further screening, evaluation, or treatment in the ED at this time prior to discharge.   Final Clinical Impression(s) / ED Diagnoses Final diagnoses:  Epigastric pain    Rx / DC Orders ED Discharge Orders          Ordered    promethazine (PHENERGAN) 25 MG suppository  Every 6 hours PRN        02/06/21 2123             Melene Plan, DO 02/06/21 2124

## 2021-02-06 NOTE — Discharge Instructions (Addendum)
Follow up with your PCP.  Return for inability to eat or drink, persistent fever >5 days, sudden worsening abdominal pain. ° °

## 2021-02-06 NOTE — ED Notes (Signed)
RN reviewed discharge instructions w/ pt. Follow up and prescriptions reviewed, pt had no further questions °

## 2023-07-07 ENCOUNTER — Other Ambulatory Visit: Payer: Self-pay | Admitting: Internal Medicine

## 2023-07-07 DIAGNOSIS — N6489 Other specified disorders of breast: Secondary | ICD-10-CM

## 2023-07-07 DIAGNOSIS — Z1231 Encounter for screening mammogram for malignant neoplasm of breast: Secondary | ICD-10-CM

## 2023-08-12 ENCOUNTER — Other Ambulatory Visit: Payer: Self-pay | Admitting: Internal Medicine

## 2023-08-12 DIAGNOSIS — N6489 Other specified disorders of breast: Secondary | ICD-10-CM

## 2023-08-13 ENCOUNTER — Other Ambulatory Visit: Payer: Self-pay | Admitting: Internal Medicine

## 2023-08-17 ENCOUNTER — Other Ambulatory Visit: Payer: Medicaid Other

## 2023-08-20 ENCOUNTER — Other Ambulatory Visit

## 2023-08-20 ENCOUNTER — Encounter

## 2023-09-02 DIAGNOSIS — R0981 Nasal congestion: Secondary | ICD-10-CM | POA: Diagnosis not present

## 2023-09-02 DIAGNOSIS — R051 Acute cough: Secondary | ICD-10-CM | POA: Diagnosis not present

## 2023-09-02 DIAGNOSIS — J449 Chronic obstructive pulmonary disease, unspecified: Secondary | ICD-10-CM | POA: Diagnosis not present

## 2023-09-02 DIAGNOSIS — J069 Acute upper respiratory infection, unspecified: Secondary | ICD-10-CM | POA: Diagnosis not present

## 2023-09-02 DIAGNOSIS — R509 Fever, unspecified: Secondary | ICD-10-CM | POA: Diagnosis not present

## 2023-09-02 DIAGNOSIS — R062 Wheezing: Secondary | ICD-10-CM | POA: Diagnosis not present
